# Patient Record
Sex: Female | Born: 1975
Health system: Southern US, Community
[De-identification: ages and names within clinical notes are randomized; demographics above are authoritative.]

## PROBLEM LIST (undated history)

## (undated) DIAGNOSIS — I10 Essential (primary) hypertension: Secondary | ICD-10-CM

## (undated) DIAGNOSIS — K209 Esophagitis, unspecified without bleeding: Secondary | ICD-10-CM

## (undated) DIAGNOSIS — G43909 Migraine, unspecified, not intractable, without status migrainosus: Secondary | ICD-10-CM

## (undated) HISTORY — DX: Esophagitis, unspecified: K20.9

## (undated) HISTORY — DX: Essential (primary) hypertension: I10

## (undated) HISTORY — DX: Migraine, unspecified, not intractable, without status migrainosus: G43.909

## (undated) HISTORY — DX: Esophagitis, unspecified without bleeding: K20.90

---

## 2003-10-17 HISTORY — PX: OTHER SURGICAL HISTORY: SHX169

## 2005-10-16 HISTORY — PX: OTHER SURGICAL HISTORY: SHX169

## 2010-11-29 DIAGNOSIS — R896 Abnormal cytological findings in specimens from other organs, systems and tissues: Secondary | ICD-10-CM | POA: Insufficient documentation

## 2013-04-21 ENCOUNTER — Ambulatory Visit (INDEPENDENT_AMBULATORY_CARE_PROVIDER_SITE_OTHER): Payer: Managed Care, Other (non HMO) | Admitting: Sports Medicine

## 2013-04-21 ENCOUNTER — Encounter: Payer: Self-pay | Admitting: Sports Medicine

## 2013-04-21 VITALS — BP 128/80 | HR 93 | Ht 63.5 in | Wt 153.0 lb

## 2013-04-21 DIAGNOSIS — M5412 Radiculopathy, cervical region: Secondary | ICD-10-CM

## 2013-04-21 MED ORDER — MELOXICAM 15 MG PO TABS: ORAL_TABLET | ORAL | Status: DC

## 2013-04-21 MED ORDER — KETOROLAC TROMETHAMINE 30 MG/ML IJ SOLN
30.0000 mg | Freq: Once | INTRAMUSCULAR | Status: AC
  Administered 2013-04-21: 30 mg via INTRAMUSCULAR

## 2013-04-21 MED ORDER — KETOROLAC TROMETHAMINE 30 MG/ML IJ SOLN: 30.0000 mg | Freq: Once | INTRAMUSCULAR | Status: DC

## 2013-04-21 NOTE — Progress Notes (Signed)
   Subjective:    I'm seeing this patient as a consultation for:  Dr. Laurence Spates  CC: Neck pain  HPI: This is a very pleasant 37 year old female who comes in with a several week history of pain that she localizes in the right side of her neck, radiating over her trapezius, and upper shoulder. She denies any trauma, and noted this came on somewhat suddenly. She finds any movement of her neck fairly painful, but denies any lower extremity symptoms, or constitutional symptoms. She also finds that her pain is significantly relieved when abducting and externally rotating her right shoulder. She was placed on prednisone, Flexeril, and narcotics by her primary care provider, and the patient is seeing me for further evaluation and definitive treatment. She denies any bowel or bladder dysfunction. Symptoms are moderate, persistent.  Past medical history, Surgical history, Family history not pertinant except as noted below, Social history, Allergies, and medications have been entered into the medical record, reviewed, and no changes needed.   Review of Systems: No headache, visual changes, nausea, vomiting, diarrhea, constipation, dizziness, abdominal pain, skin rash, fevers, chills, night sweats, weight loss, swollen lymph nodes, body aches, joint swelling, muscle aches, chest pain, shortness of breath, mood changes, visual or auditory hallucinations.   Objective:   General: Well Developed, well nourished, and in no acute distress.  Neuro/Psych: Alert and oriented x3, extra-ocular muscles intact, able to move all 4 extremities, sensation grossly intact. Skin: Warm and dry, no rashes noted.  Respiratory: Not using accessory muscles, speaking in full sentences, trachea midline.  Cardiovascular: Pulses palpable, no extremity edema. Abdomen: Does not appear distended. Neck: Inspection unremarkable. No palpable stepoffs. Negative Spurling's maneuver. Range of motion limited by pain. Grip strength and  sensation normal in bilateral hands Strength good C4 to T1 distribution No sensory change to C4 to T1 Negative Hoffman sign bilaterally Reflexes normal  Per patient report, her cervical spine x-ray showed loss of the normal cervical lordosis.  Impression and Recommendations:   This case required medical decision making of moderate complexity.

## 2013-04-21 NOTE — Patient Instructions (Addendum)
Cervical Radiculopathy  Cervical radiculopathy happens when a nerve in the neck is pinched or bruised by a slipped (herniated) disk or by arthritic changes in the bones of the cervical spine. This can occur due to an injury or as part of the normal aging process. Pressure on the cervical nerves can cause pain or numbness that runs from your neck all the way down into your arm and fingers.  CAUSES   There are many possible causes, including:   Injury.   Muscle tightness in the neck from overuse.   Swollen, painful joints (arthritis).   Breakdown or degeneration in the bones and joints of the spine (spondylosis) due to aging.   Bone spurs that may develop near the cervical nerves.  SYMPTOMS   Symptoms include pain, weakness, or numbness in the affected arm and hand. Pain can be severe or irritating. Symptoms may be worse when extending or turning the neck.  DIAGNOSIS   Your caregiver will ask about your symptoms and do a physical exam. He or she may test your strength and reflexes. X-rays, CT scans, and MRI scans may be needed in cases of injury or if the symptoms do not go away after a period of time. Electromyography (EMG) or nerve conduction testing may be done to study how your nerves and muscles are working.  TREATMENT   Your caregiver may recommend certain exercises to help relieve your symptoms. Cervical radiculopathy can, and often does, get better with time and treatment. If your problems continue, treatment options may include:   Wearing a soft collar for short periods of time.   Physical therapy to strengthen the neck muscles.   Medicines, such as nonsteroidal anti-inflammatory drugs (NSAIDs), oral corticosteroids, or spinal injections.   Surgery. Different types of surgery may be done depending on the cause of your problems.  HOME CARE INSTRUCTIONS    Put ice on the affected area.   Put ice in a plastic bag.   Place a towel between your skin and the bag.    Leave the ice on for 15-20 minutes, 3-4 times a day or as directed by your caregiver.   If ice does not help, you can try using heat. Take a warm shower or bath, or use a hot water bottle as directed by your caregiver.   You may try a gentle neck and shoulder massage.   Use a flat pillow when you sleep.   Only take over-the-counter or prescription medicines for pain, discomfort, or fever as directed by your caregiver.   If physical therapy was prescribed, follow your caregiver's directions.   If a soft collar was prescribed, use it as directed.  SEEK IMMEDIATE MEDICAL CARE IF:    Your pain gets much worse and cannot be controlled with medicines.   You have weakness or numbness in your hand, arm, face, or leg.   You have a high fever or a stiff, rigid neck.   You lose bowel or bladder control (incontinence).   You have trouble with walking, balance, or speaking.  MAKE SURE YOU:    Understand these instructions.   Will watch your condition.   Will get help right away if you are not doing well or get worse.  Document Released: 06/27/2001 Document Revised: 12/25/2011 Document Reviewed: 05/16/2011  [employer] Patient Information 2014 [employer].

## 2013-04-21 NOTE — Assessment & Plan Note (Signed)
Primary care provider has already started prednisone, Flexeril, hydrocodone. I would like her to do some rehabilitation exercises, she will visit the physical therapist once, and then do home exercises. Toradol 30 mg intramuscular today. She is to use the Flexeril at night. Return in 3 weeks, if no better will consider an MRI.

## 2013-04-22 ENCOUNTER — Ambulatory Visit (INDEPENDENT_AMBULATORY_CARE_PROVIDER_SITE_OTHER): Payer: Managed Care, Other (non HMO) | Admitting: Physical Therapy

## 2013-04-22 DIAGNOSIS — M25519 Pain in unspecified shoulder: Secondary | ICD-10-CM

## 2013-04-22 DIAGNOSIS — M542 Cervicalgia: Secondary | ICD-10-CM

## 2013-04-22 DIAGNOSIS — M5412 Radiculopathy, cervical region: Secondary | ICD-10-CM

## 2013-04-25 ENCOUNTER — Encounter (INDEPENDENT_AMBULATORY_CARE_PROVIDER_SITE_OTHER): Payer: Managed Care, Other (non HMO) | Admitting: Physical Therapy

## 2013-04-25 DIAGNOSIS — M5412 Radiculopathy, cervical region: Secondary | ICD-10-CM

## 2013-04-25 DIAGNOSIS — M25519 Pain in unspecified shoulder: Secondary | ICD-10-CM

## 2013-04-25 DIAGNOSIS — M542 Cervicalgia: Secondary | ICD-10-CM

## 2013-05-12 ENCOUNTER — Encounter: Payer: Self-pay | Admitting: Sports Medicine

## 2013-05-12 ENCOUNTER — Ambulatory Visit (INDEPENDENT_AMBULATORY_CARE_PROVIDER_SITE_OTHER): Payer: Managed Care, Other (non HMO) | Admitting: Sports Medicine

## 2013-05-12 VITALS — BP 148/91 | HR 78 | Wt 160.0 lb

## 2013-05-12 DIAGNOSIS — M5412 Radiculopathy, cervical region: Secondary | ICD-10-CM

## 2013-05-12 NOTE — Assessment & Plan Note (Signed)
Resolved with conservative measures. Return as needed.

## 2013-05-12 NOTE — Progress Notes (Signed)
  Subjective:    CC: Follow up  HPI: I saw this pleasant 37 year old female approximately 3 weeks ago with a right-sided cervical radiculitis. She has been through formal physical therapy, prednisone, Mobic, muscle relaxants. She returns today with symptoms resolved. She also notes that she continues to improve more and more on a daily basis if she does home exercises. She is happy with the results so far.  Past medical history, Surgical history, Family history not pertinant except as noted below, Social history, Allergies, and medications have been entered into the medical record, reviewed, and no changes needed.   Review of Systems: No fevers, chills, night sweats, weight loss, chest pain, or shortness of breath.   Objective:    General: Well Developed, well nourished, and in no acute distress.  Neuro: Alert and oriented x3, extra-ocular muscles intact, sensation grossly intact.  HEENT: Normocephalic, atraumatic, pupils equal round reactive to light, neck supple, no masses, no lymphadenopathy, thyroid nonpalpable.  Skin: Warm and dry, no rashes. Cardiac: Regular rate and rhythm, no murmurs rubs or gallops, no lower extremity edema.  Respiratory: Clear to auscultation bilaterally. Not using accessory muscles, speaking in full sentences. Neck: Inspection unremarkable. No palpable stepoffs. Negative Spurling's maneuver. Full neck range of motion Grip strength and sensation normal in bilateral hands Strength good C4 to T1 distribution No sensory change to C4 to T1 Negative Hoffman sign bilaterally Reflexes normal  Impression and Recommendations:

## 2013-07-11 ENCOUNTER — Ambulatory Visit (INDEPENDENT_AMBULATORY_CARE_PROVIDER_SITE_OTHER): Payer: Managed Care, Other (non HMO) | Admitting: Sports Medicine

## 2013-07-11 ENCOUNTER — Telehealth: Payer: Self-pay | Admitting: *Deleted

## 2013-07-11 ENCOUNTER — Encounter: Payer: Self-pay | Admitting: Sports Medicine

## 2013-07-11 VITALS — BP 128/73 | HR 78 | Wt 159.0 lb

## 2013-07-11 DIAGNOSIS — L210 Seborrhea capitis: Secondary | ICD-10-CM | POA: Insufficient documentation

## 2013-07-11 DIAGNOSIS — E781 Pure hyperglyceridemia: Secondary | ICD-10-CM

## 2013-07-11 DIAGNOSIS — I10 Essential (primary) hypertension: Secondary | ICD-10-CM | POA: Insufficient documentation

## 2013-07-11 DIAGNOSIS — M5412 Radiculopathy, cervical region: Secondary | ICD-10-CM

## 2013-07-11 DIAGNOSIS — Z0000 Encounter for general adult medical examination without abnormal findings: Secondary | ICD-10-CM | POA: Insufficient documentation

## 2013-07-11 DIAGNOSIS — M25521 Pain in right elbow: Secondary | ICD-10-CM | POA: Insufficient documentation

## 2013-07-11 DIAGNOSIS — Z299 Encounter for prophylactic measures, unspecified: Secondary | ICD-10-CM

## 2013-07-11 MED ORDER — MELOXICAM 15 MG PO TABS: ORAL_TABLET | ORAL | Status: DC

## 2013-07-11 MED ORDER — CYCLOBENZAPRINE HCL 10 MG PO TABS: 10.0000 mg | ORAL_TABLET | Freq: Three times a day (TID) | ORAL | Status: DC | PRN

## 2013-07-11 MED ORDER — BETAMETHASONE DIPROPIONATE 0.05 % EX LOTN: TOPICAL_LOTION | Freq: Two times a day (BID) | CUTANEOUS | Status: DC

## 2013-07-11 NOTE — Assessment & Plan Note (Signed)
Adding betamethasone 0.05% lotion.

## 2013-07-11 NOTE — Assessment & Plan Note (Signed)
Persistent pain, dorsal scapular now. Restart meloxicam and home exercises. Cervical spine MRI. She has had x-rays but the beginning of this year the results of which are not yet available. Return to go over results of the MRI. This will be for interventional injection planning per patient request.

## 2013-07-11 NOTE — Progress Notes (Signed)
  Subjective:    CC: Complete physical  HPI:  Preventive measures: Up-to-date on Pap smear, tetanus. Today she will get the flu shot. Pap smear is done by a gynecologist in the area.  Cervical radiculitis will improve for a while, now still having some pain which he localizes over her dorsal scapular region. Worse with turning her neck to the left and to the right. She does recall x-rays in the past approximately the beginning of this year it did show "arthritis" per patient.  Right elbow pain: Just started recently, localized over the brachial radialis, worse with supinating and resisted supination.  Scalp seborrhea: Needs refill of betamethasone 0.05% lotion.  Past medical history, Surgical history, Family history not pertinant except as noted below, Social history, Allergies, and medications have been entered into the medical record, reviewed, and no changes needed.   Review of Systems: No headache, visual changes, nausea, vomiting, diarrhea, constipation, dizziness, abdominal pain, skin rash, fevers, chills, night sweats, swollen lymph nodes, weight loss, chest pain, body aches, joint swelling, muscle aches, shortness of breath, mood changes, visual or auditory hallucinations.  Objective:    General: Well Developed, well nourished, and in no acute distress.  Neuro: Alert and oriented x3, extra-ocular muscles intact, sensation grossly intact.  HEENT: Normocephalic, atraumatic, pupils equal round reactive to light, neck supple, no masses, no lymphadenopathy, thyroid nonpalpable.  Skin: Warm and dry, no rashes noted.  Cardiac: Regular rate and rhythm, no murmurs rubs or gallops.  Respiratory: Clear to auscultation bilaterally. Not using accessory muscles, speaking in full sentences.  Abdominal: Soft, nontender, nondistended, positive bowel sounds, no masses, no organomegaly.  Right Elbow: Unremarkable to inspection. Range of motion full pronation, supination, flexion,  extension. Strength is full to all of the above directions , there is reproduction of pain with activation of the supinator. There is also discrete tenderness to palpation over the brachioradialis proximally. Stable to varus, valgus stress. Negative moving valgus stress test. Ulnar nerve does not sublux. Negative cubital tunnel Tinel's. Neck: Inspection unremarkable. No palpable stepoffs. Negative Spurling's maneuver. Full neck range of motion Grip strength and sensation normal in bilateral hands Strength good C4 to T1 distribution No sensory change to C4 to T1 Negative Hoffman sign bilaterally Reflexes hypoactive predominately to biceps and brachioradialis on the right side.  Impression and Recommendations:    The patient was counselled, risk factors were discussed, anticipatory guidance given.

## 2013-07-11 NOTE — Assessment & Plan Note (Signed)
Well controlled on Avapro and hydrochlorothiazide. No changes.

## 2013-07-11 NOTE — Assessment & Plan Note (Signed)
Complete physical exam performed. Checking routine blood work.

## 2013-07-11 NOTE — Assessment & Plan Note (Signed)
This likely represents a supinator strain. Mobic. Tennis elbow exercises.

## 2013-07-11 NOTE — Telephone Encounter (Signed)
PA obtained for MRI Cervical spine w/o contrast.  Auth# [id number].  Meyer Cory, LPN

## 2013-07-15 ENCOUNTER — Ambulatory Visit (HOSPITAL_BASED_OUTPATIENT_CLINIC_OR_DEPARTMENT_OTHER): Payer: Managed Care, Other (non HMO)

## 2013-07-15 LAB — COMPREHENSIVE METABOLIC PANEL
AST: 20 U/L (ref 0–37)
Albumin: 4 g/dL (ref 3.5–5.2)
Alkaline Phosphatase: 63 U/L (ref 39–117)
BUN: 11 mg/dL (ref 6–23)
Calcium: 9.1 mg/dL (ref 8.4–10.5)
Glucose, Bld: 87 mg/dL (ref 70–99)
Potassium: 4.3 mEq/L (ref 3.5–5.3)
Sodium: 139 mEq/L (ref 135–145)
Total Bilirubin: 0.6 mg/dL (ref 0.3–1.2)
Total Protein: 7 g/dL (ref 6.0–8.3)

## 2013-07-15 LAB — LIPID PANEL
Cholesterol: 184 mg/dL (ref 0–200)
HDL: 38 mg/dL — ABNORMAL LOW (ref >39)
LDL Cholesterol: 103 mg/dL — ABNORMAL HIGH (ref 0–99)
Total CHOL/HDL Ratio: 4.8 ratio
Triglycerides: 213 mg/dL — ABNORMAL HIGH (ref <150)
VLDL: 43 mg/dL — ABNORMAL HIGH (ref 0–40)

## 2013-07-15 LAB — CBC
HCT: 39.4 % (ref 36.0–46.0)
Hemoglobin: 13.7 g/dL (ref 12.0–15.0)
MCH: 30.2 pg (ref 26.0–34.0)
MCHC: 34.8 g/dL (ref 30.0–36.0)
MCV: 86.8 fL (ref 78.0–100.0)
Platelets: 240 K/uL (ref 150–400)
RBC: 4.54 MIL/uL (ref 3.87–5.11)
RDW: 14.1 % (ref 11.5–15.5)
WBC: 6.2 K/uL (ref 4.0–10.5)

## 2013-07-15 LAB — TSH: TSH: 0.965 u[IU]/mL (ref 0.350–4.500)

## 2013-07-15 LAB — COMPREHENSIVE METABOLIC PANEL WITH GFR
ALT: 31 U/L (ref 0–35)
CO2: 28 meq/L (ref 19–32)
Chloride: 102 meq/L (ref 96–112)
Creat: 0.62 mg/dL (ref 0.50–1.10)

## 2013-07-16 ENCOUNTER — Telehealth: Payer: Self-pay | Admitting: *Deleted

## 2013-07-16 NOTE — Telephone Encounter (Signed)
Facility changed to [facility] per pt request.  Meyer Cory, LPN

## 2013-07-17 DIAGNOSIS — E781 Pure hyperglyceridemia: Secondary | ICD-10-CM | POA: Insufficient documentation

## 2013-07-17 MED ORDER — OMEGA-3-ACID ETHYL ESTERS 1 G PO CAPS: 2.0000 g | ORAL_CAPSULE | Freq: Two times a day (BID) | ORAL | Status: DC

## 2013-07-17 NOTE — Addendum Note (Signed)
Addended by: Monica Becton on: 07/17/2013 08:59 AM   Modules accepted: Orders

## 2013-07-19 ENCOUNTER — Ambulatory Visit
Admission: RE | Admit: 2013-07-19 | Discharge: 2013-07-19 | Disposition: A | Discharge status: Home / Self Care | Payer: BC Managed Care – PPO | Source: Ambulatory Visit | Attending: Sports Medicine | Admitting: Sports Medicine

## 2013-07-19 DIAGNOSIS — M5412 Radiculopathy, cervical region: Secondary | ICD-10-CM

## 2013-07-22 ENCOUNTER — Encounter: Payer: Self-pay | Admitting: Sports Medicine

## 2013-07-23 ENCOUNTER — Encounter: Payer: Self-pay | Admitting: Sports Medicine

## 2013-07-23 ENCOUNTER — Ambulatory Visit (INDEPENDENT_AMBULATORY_CARE_PROVIDER_SITE_OTHER): Payer: Managed Care, Other (non HMO) | Admitting: Sports Medicine

## 2013-07-23 VITALS — BP 129/82 | HR 79 | Wt 153.0 lb

## 2013-07-23 DIAGNOSIS — I10 Essential (primary) hypertension: Secondary | ICD-10-CM

## 2013-07-23 DIAGNOSIS — E781 Pure hyperglyceridemia: Secondary | ICD-10-CM

## 2013-07-23 DIAGNOSIS — M5412 Radiculopathy, cervical region: Secondary | ICD-10-CM

## 2013-07-23 NOTE — Assessment & Plan Note (Signed)
Recently started fish oil. LDL goal is less than 130.

## 2013-07-23 NOTE — Assessment & Plan Note (Signed)
Well controlled. No changes. 

## 2013-07-23 NOTE — Progress Notes (Signed)
  Subjective:    CC: Followup  HPI: Hypertension: Stable and well controlled.  Hypertriglyceridemia: Stable on fish oil.  Cervical radiculitis: Seems to be left worse than right C6 over the dorsal scapular nerve. She has failed physical therapy, steroids, NSAIDs, muscle relaxers. Had an MRI done recently, the results of which will be dictated below.  Past medical history, Surgical history, Family history not pertinant except as noted below, Social history, Allergies, and medications have been entered into the medical record, reviewed, and no changes needed.   Review of Systems: No fevers, chills, night sweats, weight loss, chest pain, or shortness of breath.   Objective:    General: Well Developed, well nourished, and in no acute distress.  Neuro: Alert and oriented x3, extra-ocular muscles intact, sensation grossly intact.  HEENT: Normocephalic, atraumatic, pupils equal round reactive to light, neck supple, no masses, no lymphadenopathy, thyroid nonpalpable.  Skin: Warm and dry, no rashes. Cardiac: Regular rate and rhythm, no murmurs rubs or gallops, no lower extremity edema.  Respiratory: Clear to auscultation bilaterally. Not using accessory muscles, speaking in full sentences.  Cervical spine MRI shows a large disc protrusion at C5-C6 level causing mild central canal stenosis and bilateral foraminal stenosis, there is a smaller disc protrusion at the C6-C7 level.  Impression and Recommendations:

## 2013-07-23 NOTE — Assessment & Plan Note (Signed)
This pleasant young lady has left and right, left worse than right cervical radiculitis, clinically in a C6 distribution over the dorsal scapula. She has failed physical therapy, NSAIDs, steroids, muscle relaxers. At this point we are going to proceed with epidural injection. Left-sided C6-C7 interlaminar epidural steroid injection. She is resistant to taking any form of oral medication for this.

## 2013-08-14 ENCOUNTER — Ambulatory Visit
Admission: RE | Admit: 2013-08-14 | Discharge: 2013-08-14 | Disposition: A | Discharge status: Home / Self Care | Payer: BC Managed Care – PPO | Source: Ambulatory Visit | Attending: Sports Medicine | Admitting: Sports Medicine

## 2013-08-14 MED ORDER — TRIAMCINOLONE ACETONIDE 40 MG/ML IJ SUSP (RADIOLOGY)
60.0000 mg | Freq: Once | INTRAMUSCULAR | Status: AC
  Administered 2013-08-14: 60 mg via EPIDURAL

## 2013-08-14 MED ORDER — IOHEXOL 300 MG/ML  SOLN
1.0000 mL | Freq: Once | INTRAMUSCULAR | Status: AC | PRN
  Administered 2013-08-14: 1 mL via EPIDURAL

## 2013-08-21 ENCOUNTER — Other Ambulatory Visit: Payer: Self-pay

## 2013-12-26 LAB — TESTOSTERONE
TESTOSTERONE FREE: 6.3
TESTOSTERONE: 33

## 2014-01-06 ENCOUNTER — Encounter: Payer: Self-pay | Admitting: *Deleted

## 2014-03-10 ENCOUNTER — Encounter: Payer: Self-pay | Admitting: Sports Medicine

## 2014-03-10 MED ORDER — PROPRANOLOL HCL 40 MG PO TABS: ORAL_TABLET | ORAL | Status: DC

## 2014-03-24 ENCOUNTER — Other Ambulatory Visit: Payer: Self-pay

## 2014-03-24 MED ORDER — DIAZEPAM 5 MG PO TABS: ORAL_TABLET | ORAL | Status: DC

## 2014-03-24 NOTE — Addendum Note (Signed)
Addended by: Monica Becton on: 03/24/2014 12:01 PM   Modules accepted: Orders, Medications

## 2014-07-09 ENCOUNTER — Encounter: Payer: Self-pay | Admitting: Sports Medicine

## 2014-07-13 ENCOUNTER — Encounter: Payer: Self-pay | Admitting: Sports Medicine

## 2014-07-14 NOTE — Telephone Encounter (Signed)
Please see patient's note, please fax my note to the pain doctor listed.

## 2014-07-17 ENCOUNTER — Ambulatory Visit (INDEPENDENT_AMBULATORY_CARE_PROVIDER_SITE_OTHER): Payer: BC Managed Care – PPO | Admitting: Sports Medicine

## 2014-07-17 ENCOUNTER — Encounter: Payer: Self-pay | Admitting: Sports Medicine

## 2014-07-17 VITALS — BP 133/85 | HR 72 | Ht 63.0 in | Wt 154.0 lb

## 2014-07-17 DIAGNOSIS — M545 Low back pain, unspecified: Secondary | ICD-10-CM

## 2014-07-17 DIAGNOSIS — M5136 Other intervertebral disc degeneration, lumbar region: Secondary | ICD-10-CM | POA: Insufficient documentation

## 2014-07-17 DIAGNOSIS — M5412 Radiculopathy, cervical region: Secondary | ICD-10-CM

## 2014-07-17 DIAGNOSIS — M51369 Other intervertebral disc degeneration, lumbar region without mention of lumbar back pain or lower extremity pain: Secondary | ICD-10-CM | POA: Insufficient documentation

## 2014-07-17 MED ORDER — DICLOFENAC SODIUM 75 MG PO TBEC: 75.0000 mg | DELAYED_RELEASE_TABLET | Freq: Two times a day (BID) | ORAL | Status: DC

## 2014-07-17 NOTE — Assessment & Plan Note (Signed)
Patient will obtain her lumbar spine MRI. From that results we can decide where to proceed with intervention on her lumbar spine.

## 2014-07-17 NOTE — Progress Notes (Signed)
  Subjective:    CC: Followup  HPI: Cervical degenerative disc disease: With moderate central canal stenosis, she had a good response to a cervical epidural with complete relief of radicular symptoms and pain approximately one year ago. Unfortunately pain has recurred, she no longer has radicular symptoms. Currently taking meloxicam and cyclobenzaprine. It is moderate, persistent. She does not desire to consider surgical intervention.  Low back pain: History of lumbar degenerative disc disease, she does tell me she had an MRI at an outside facility distant past, she will be able to provide a CD for me for review.  Past medical history, Surgical history, Family history not pertinant except as noted below, Social history, Allergies, and medications have been entered into the medical record, reviewed, and no changes needed.   Review of Systems: No fevers, chills, night sweats, weight loss, chest pain, or shortness of breath.   Objective:    General: Well Developed, well nourished, and in no acute distress.  Neuro: Alert and oriented x3, extra-ocular muscles intact, sensation grossly intact.  HEENT: Normocephalic, atraumatic, pupils equal round reactive to light, neck supple, no masses, no lymphadenopathy, thyroid nonpalpable.  Skin: Warm and dry, no rashes. Cardiac: Regular rate and rhythm, no murmurs rubs or gallops, no lower extremity edema.  Respiratory: Clear to auscultation bilaterally. Not using accessory muscles, speaking in full sentences. Neck: Negative spurling's Full neck range of motion Grip strength and sensation normal in bilateral hands Strength good C4 to T1 distribution No sensory change to C4 to T1 Reflexes normal  Impression and Recommendations:

## 2014-07-17 NOTE — Assessment & Plan Note (Signed)
Excellent response to prior epidural. She does have a C5-6 and C6-C7 moderate sized disc protrusions likely causing her right-sided periscapular symptoms. Switching to Voltaren, cyclobenzaprine one half tab that time, repeat epidural.

## 2014-07-21 ENCOUNTER — Other Ambulatory Visit: Payer: Self-pay | Admitting: Psychiatry

## 2014-07-21 DIAGNOSIS — G43719 Chronic migraine without aura, intractable, without status migrainosus: Secondary | ICD-10-CM

## 2014-07-22 ENCOUNTER — Other Ambulatory Visit: Payer: BC Managed Care – PPO

## 2014-07-22 ENCOUNTER — Telehealth: Payer: Self-pay

## 2014-07-22 ENCOUNTER — Encounter: Payer: Self-pay | Admitting: Sports Medicine

## 2014-07-22 NOTE — Telephone Encounter (Signed)
Debbie Johnston called stated that patient decided not to have epidural done at this time. Debbie Johnston,CMA

## 2014-07-27 ENCOUNTER — Other Ambulatory Visit: Payer: Self-pay | Admitting: Sports Medicine

## 2014-07-27 ENCOUNTER — Telehealth: Payer: Self-pay

## 2014-07-27 DIAGNOSIS — M5136 Other intervertebral disc degeneration, lumbar region: Secondary | ICD-10-CM

## 2014-07-27 DIAGNOSIS — M51369 Other intervertebral disc degeneration, lumbar region without mention of lumbar back pain or lower extremity pain: Secondary | ICD-10-CM

## 2014-07-27 NOTE — Telephone Encounter (Signed)
MRI lumbar spine without contrast does not require PA, per Fanny SkatesGretchen G.

## 2014-07-28 ENCOUNTER — Telehealth: Payer: Self-pay | Admitting: *Deleted

## 2014-07-28 NOTE — Telephone Encounter (Signed)
Helen from radiology called to advise that patient would like to have her MRI done in [place]. Elease Hashimotoatricia in referrels notified. Corliss SkainsJamie Tariq Pernell, CMA

## 2014-07-31 ENCOUNTER — Ambulatory Visit
Admission: RE | Admit: 2014-07-31 | Discharge: 2014-07-31 | Disposition: A | Discharge status: Home / Self Care | Payer: BC Managed Care – PPO | Source: Ambulatory Visit | Attending: Psychiatry | Admitting: Psychiatry

## 2014-07-31 DIAGNOSIS — G43719 Chronic migraine without aura, intractable, without status migrainosus: Secondary | ICD-10-CM

## 2014-08-12 ENCOUNTER — Ambulatory Visit
Admission: RE | Admit: 2014-08-12 | Discharge: 2014-08-12 | Disposition: A | Discharge status: Home / Self Care | Payer: BC Managed Care – PPO | Source: Ambulatory Visit | Attending: Sports Medicine | Admitting: Sports Medicine

## 2014-08-12 DIAGNOSIS — M5136 Other intervertebral disc degeneration, lumbar region: Secondary | ICD-10-CM

## 2014-08-17 ENCOUNTER — Ambulatory Visit (INDEPENDENT_AMBULATORY_CARE_PROVIDER_SITE_OTHER): Payer: BC Managed Care – PPO | Admitting: Sports Medicine

## 2014-08-17 DIAGNOSIS — M5412 Radiculopathy, cervical region: Secondary | ICD-10-CM

## 2014-08-17 DIAGNOSIS — M5136 Other intervertebral disc degeneration, lumbar region: Secondary | ICD-10-CM

## 2014-08-17 DIAGNOSIS — M51369 Other intervertebral disc degeneration, lumbar region without mention of lumbar back pain or lower extremity pain: Secondary | ICD-10-CM

## 2014-08-17 MED ORDER — DIAZEPAM 10 MG PO TABS: ORAL_TABLET | ORAL | Status: DC

## 2014-08-17 NOTE — Assessment & Plan Note (Addendum)
It has been one year since the most recent cervical epidural. Repeat left-sided C6-C7 intralaminar epidural. Considering severity of lumbar spondylosis and cervical spondylosis, combined with diffuse white matter hyperintensities on brain MRI we are going to do a rheumatoid workup.

## 2014-08-17 NOTE — Progress Notes (Signed)
  Subjective:    CC: follow-up MRI  HPI: This is a very pleasant 38 year old female, she does have extensive cervical and lumbar spondylosis. She had a cervical epidural approximately 13 months ago and had a fantastic response. Now starting to have a slight increase in pain.  Lumbar spondylosis: With discogenic type back pain worse than the left side, and severe disc and facet disease on MRI. She has failed conservative measures and is amenable to interventional treatment.  Headaches: Brain MRI per neurology, unfortunately there were scattered T2 hyperintensities, she is going to undergo a spinal tap for further evaluation for possible multiple sclerosis. Considering her extensive cervical and lumbar spondylosis we will probably also perform a rheumatoid workup.  Past medical history, Surgical history, Family history not pertinant except as noted below, Social history, Allergies, and medications have been entered into the medical record, reviewed, and no changes needed.   Review of Systems: No fevers, chills, night sweats, weight loss, chest pain, or shortness of breath.   Objective:    General: Well Developed, well nourished, and in no acute distress.  Neuro: Alert and oriented x3, extra-ocular muscles intact, sensation grossly intact.  HEENT: Normocephalic, atraumatic, pupils equal round reactive to light, neck supple, no masses, no lymphadenopathy, thyroid nonpalpable.  Skin: Warm and dry, no rashes. Cardiac: Regular rate and rhythm, no murmurs rubs or gallops, no lower extremity edema.  Respiratory: Clear to auscultation bilaterally. Not using accessory muscles, speaking in full sentences.  Lumbar spine MRI shows multilevel degenerative disc disease worst at the L4-5 and the L5-S1 level. There is also multilevel bilateral facet arthritis from L3-S1.  Impression and Recommendations:

## 2014-08-17 NOTE — Assessment & Plan Note (Signed)
Severe multilevel degenerative disc disease. Worst at the L5-S1 level. There is also multilevel facet arthritis from L3-S1 bilaterally. Pain is worst on the left. We will start with a left-sided L5-S1 interlaminar epidural. If no relief then we can proceed with facet injections.

## 2014-08-19 ENCOUNTER — Telehealth: Payer: Self-pay | Admitting: *Deleted

## 2014-08-19 MED ORDER — TRAMADOL HCL 50 MG PO TABS: ORAL_TABLET | ORAL | Status: DC

## 2014-08-19 NOTE — Telephone Encounter (Signed)
Dianesse calls requesting a medication be called in for her headaches. She said that she saw you yesterday. Corliss SkainsJamie Darlyne Schmiesing, CMA

## 2014-08-19 NOTE — Telephone Encounter (Signed)
Adding tramadol for pain, prescription is inbox.

## 2014-08-19 NOTE — Telephone Encounter (Signed)
Patient notified. Would like to know if you could give her a work slip since she missed work 1 day? Please advise. Corliss SkainsJamie Nico Rogness, CMA

## 2014-08-20 NOTE — Telephone Encounter (Signed)
Patient said that she was going to contact you through MyChart about the days. She was here in the office and I think it is going to be today and tomorrow. Debbie SkainsJamie Tevion Johnston, CMA

## 2014-08-20 NOTE — Telephone Encounter (Signed)
Sure, but....what day?

## 2014-09-01 ENCOUNTER — Ambulatory Visit
Admission: RE | Admit: 2014-09-01 | Discharge: 2014-09-01 | Disposition: A | Discharge status: Home / Self Care | Payer: BC Managed Care – PPO | Source: Ambulatory Visit | Attending: Sports Medicine | Admitting: Sports Medicine

## 2014-09-01 DIAGNOSIS — M5412 Radiculopathy, cervical region: Secondary | ICD-10-CM

## 2014-09-01 MED ORDER — IOHEXOL 300 MG/ML  SOLN
1.0000 mL | Freq: Once | INTRAMUSCULAR | Status: AC | PRN
  Administered 2014-09-01: 1 mL via EPIDURAL

## 2014-09-01 MED ORDER — TRIAMCINOLONE ACETONIDE 40 MG/ML IJ SUSP (RADIOLOGY)
60.0000 mg | Freq: Once | INTRAMUSCULAR | Status: AC
Start: 2014-09-01 — End: 2014-09-01
  Administered 2014-09-01: 60 mg via EPIDURAL

## 2014-09-01 NOTE — Discharge Instructions (Signed)

## 2014-09-14 ENCOUNTER — Ambulatory Visit (INDEPENDENT_AMBULATORY_CARE_PROVIDER_SITE_OTHER): Payer: BC Managed Care – PPO | Admitting: Sports Medicine

## 2014-09-14 ENCOUNTER — Encounter: Payer: Self-pay | Admitting: Sports Medicine

## 2014-09-14 VITALS — BP 130/83 | HR 83 | Ht 63.0 in | Wt 156.0 lb

## 2014-09-14 DIAGNOSIS — M5412 Radiculopathy, cervical region: Secondary | ICD-10-CM | POA: Diagnosis not present

## 2014-09-14 DIAGNOSIS — M51369 Other intervertebral disc degeneration, lumbar region without mention of lumbar back pain or lower extremity pain: Secondary | ICD-10-CM

## 2014-09-14 DIAGNOSIS — K209 Esophagitis, unspecified without bleeding: Secondary | ICD-10-CM | POA: Insufficient documentation

## 2014-09-14 DIAGNOSIS — M5136 Other intervertebral disc degeneration, lumbar region: Secondary | ICD-10-CM | POA: Diagnosis not present

## 2014-09-14 NOTE — Assessment & Plan Note (Signed)
Stable, continue Nexium.  °

## 2014-09-14 NOTE — Assessment & Plan Note (Signed)
Still awaiting lumbar epidural. She has severe multilevel degenerative disc disease with multilevel facet arthritis from L3-S1 bilaterally. We did order a left-sided L5-S1 interlaminar which she has to schedule, if no relief then we can proceed with facet injections. Return to see me 2 weeks after her lumbar epidural.

## 2014-09-14 NOTE — Assessment & Plan Note (Signed)
Resolved after cervical epidural

## 2014-09-14 NOTE — Progress Notes (Signed)
  Subjective:    CC: Follow-up  HPI: Cervical degenerative disc disease: Resolved after cervical epidural.  Lumbar spondylosis: Has not yet had lumbar epidural.  Headaches: Resolved, she does have a neurologist and did have various T2 hyperintensities on her MRI that were nonspecific. Rheumatoid workup was negative.  Esophagitis: Noticed on endoscopy, biopsies were taken that showed simple esophagitis without evidence of malignancy, no evidence of Barrett's esophagus, and no fungal organisms seen. She is doing extremely well with Nexium daily.  Past medical history, Surgical history, Family history not pertinant except as noted below, Social history, Allergies, and medications have been entered into the medical record, reviewed, and no changes needed.   Review of Systems: No fevers, chills, night sweats, weight loss, chest pain, or shortness of breath.   Objective:    General: Well Developed, well nourished, and in no acute distress.  Neuro: Alert and oriented x3, extra-ocular muscles intact, sensation grossly intact.  HEENT: Normocephalic, atraumatic, pupils equal round reactive to light, neck supple, no masses, no lymphadenopathy, thyroid nonpalpable.  Skin: Warm and dry, no rashes. Cardiac: Regular rate and rhythm, no murmurs rubs or gallops, no lower extremity edema.  Respiratory: Clear to auscultation bilaterally. Not using accessory muscles, speaking in full sentences.  Impression and Recommendations:

## 2014-09-15 ENCOUNTER — Encounter: Payer: Self-pay | Admitting: Sports Medicine

## 2014-09-29 ENCOUNTER — Ambulatory Visit
Admission: RE | Admit: 2014-09-29 | Discharge: 2014-09-29 | Disposition: A | Discharge status: Home / Self Care | Payer: BC Managed Care – PPO | Source: Ambulatory Visit | Attending: Sports Medicine | Admitting: Sports Medicine

## 2014-09-29 DIAGNOSIS — M5136 Other intervertebral disc degeneration, lumbar region: Secondary | ICD-10-CM

## 2014-09-29 MED ORDER — METHYLPREDNISOLONE ACETATE 40 MG/ML INJ SUSP (RADIOLOG
120.0000 mg | Freq: Once | INTRAMUSCULAR | Status: AC
  Administered 2014-09-29: 120 mg via EPIDURAL

## 2014-09-29 MED ORDER — IOHEXOL 180 MG/ML  SOLN
1.0000 mL | Freq: Once | INTRAMUSCULAR | Status: AC | PRN
  Administered 2014-09-29: 1 mL via EPIDURAL

## 2014-10-01 ENCOUNTER — Encounter: Payer: Self-pay | Admitting: Sports Medicine

## 2014-10-01 MED ORDER — BUTALBITAL-APAP-CAFFEINE 50-325-40 MG PO TABS: 1.0000 | ORAL_TABLET | Freq: Three times a day (TID) | ORAL | Status: DC | PRN

## 2015-01-28 ENCOUNTER — Other Ambulatory Visit: Payer: Self-pay | Admitting: Sports Medicine

## 2015-01-28 MED ORDER — IRBESARTAN 150 MG PO TABS: 150.0000 mg | ORAL_TABLET | Freq: Two times a day (BID) | ORAL | Status: DC

## 2015-03-01 ENCOUNTER — Encounter: Payer: Self-pay | Admitting: Sports Medicine

## 2015-03-01 ENCOUNTER — Ambulatory Visit (INDEPENDENT_AMBULATORY_CARE_PROVIDER_SITE_OTHER): Payer: BLUE CROSS/BLUE SHIELD | Admitting: Sports Medicine

## 2015-03-01 VITALS — BP 135/80 | HR 91 | Wt 153.0 lb

## 2015-03-01 DIAGNOSIS — R002 Palpitations: Secondary | ICD-10-CM | POA: Insufficient documentation

## 2015-03-01 DIAGNOSIS — G43009 Migraine without aura, not intractable, without status migrainosus: Secondary | ICD-10-CM | POA: Diagnosis not present

## 2015-03-01 DIAGNOSIS — G43909 Migraine, unspecified, not intractable, without status migrainosus: Secondary | ICD-10-CM | POA: Insufficient documentation

## 2015-03-01 DIAGNOSIS — L989 Disorder of the skin and subcutaneous tissue, unspecified: Secondary | ICD-10-CM | POA: Insufficient documentation

## 2015-03-01 DIAGNOSIS — M5412 Radiculopathy, cervical region: Secondary | ICD-10-CM

## 2015-03-01 MED ORDER — TOPIRAMATE 50 MG PO TABS: ORAL_TABLET | ORAL | Status: DC

## 2015-03-01 MED ORDER — ELETRIPTAN HYDROBROMIDE 20 MG PO TABS: ORAL_TABLET | ORAL | Status: DC

## 2015-03-01 NOTE — Assessment & Plan Note (Signed)
For dominantly on the legs and thighs, asymptomatic, none are present today. She did show me pictures of several reddish macules, circular. When these recur she will come back and we can biopsy it

## 2015-03-01 NOTE — Assessment & Plan Note (Signed)
Referral to cardiology, patient will probably need a Holter monitor, palpitations are approximately once per week.

## 2015-03-01 NOTE — Assessment & Plan Note (Signed)
Repeat cervical epidural, 7 month response to the previous injection.

## 2015-03-01 NOTE — Assessment & Plan Note (Signed)
With multiple headaches per month. She did have some hyperintensities, T2 on a brain MRI, is currently being worked up by neurology, they're simply going to proceed with a another brain MRI in one year, lumbar puncture was recommended for opening pressure, to look for pseudotumor cerebri however we will wait until the next brain MRI. Until then I will treat her with Topamax and Relpax. Keep a migraine diary.

## 2015-03-01 NOTE — Progress Notes (Signed)
  Subjective:    CC: Headaches  HPI: This is a pleasant 39-year-old female, she has been being worked up for abnormal T2 hyperintensities in the white matter on a brain MRI, neurology has recommended a lumbar puncture however they have a very low suspicion for multiple sclerosis, and are happy with waiting until next year and to simply repeat an MRI. Unfortunately she has been having headaches, moderate, persistent bitemporal and throbbing without photophobia or phonophobia, has lasted 4 days, associated with nausea. Symptoms do sound very migrainous, she is amenable to trying both preventive and abortive treatments, she has 4+ migraines per month.  Cervical radiculopathy: Did really well for several months after a cervical epidural, she has noted that the headaches have worsened as well since her epidural has worn off. She does desire repeat interventional treatment.  Skin lesions: Random, localized in both lower extremities, no itching, no burning, she has one on her leg is starting to go away but shows me pictures of several others.  Palpitations: Nonexertional, random, no syncope or presyncope. No history of cardiac disease.  Past medical history, Surgical history, Family history not pertinant except as noted below, Social history, Allergies, and medications have been entered into the medical record, reviewed, and no changes needed.   Review of Systems: No fevers, chills, night sweats, weight loss, chest pain, or shortness of breath.   Objective:    General: Well Developed, well nourished, and in no acute distress.  Neuro: Alert and oriented x3, extra-ocular muscles intact, sensation grossly intact. Cranial nerves II through XII are intact, motor, sensory, and coordinative functions are all intact. HEENT: Normocephalic, atraumatic, pupils equal round reactive to light, neck supple, no masses, no lymphadenopathy, thyroid nonpalpable.  Skin: Warm and dry, no rashes. Cardiac: Regular rate and  rhythm, no murmurs rubs or gallops, no lower extremity edema.  Respiratory: Clear to auscultation bilaterally. Not using accessory muscles, speaking in full sentences.  Impression and Recommendations:   I spent 40 minutes with this patient, greater than 50% was face-to-face time counseling regarding the above diagnoses.

## 2015-03-02 ENCOUNTER — Encounter: Payer: Self-pay | Admitting: Cardiology

## 2015-03-08 ENCOUNTER — Encounter: Payer: Self-pay | Admitting: Sports Medicine

## 2015-03-08 MED ORDER — RIZATRIPTAN BENZOATE 10 MG PO TBDP: 10.0000 mg | ORAL_TABLET | ORAL | Status: DC | PRN

## 2015-03-29 ENCOUNTER — Ambulatory Visit: Payer: BLUE CROSS/BLUE SHIELD | Admitting: Sports Medicine

## 2015-04-07 ENCOUNTER — Other Ambulatory Visit: Payer: Self-pay | Admitting: *Deleted

## 2015-04-07 ENCOUNTER — Encounter: Payer: Self-pay | Admitting: Sports Medicine

## 2015-04-07 DIAGNOSIS — G43009 Migraine without aura, not intractable, without status migrainosus: Secondary | ICD-10-CM

## 2015-04-07 MED ORDER — TOPIRAMATE 50 MG PO TABS: ORAL_TABLET | ORAL | Status: DC

## 2015-04-28 ENCOUNTER — Ambulatory Visit: Payer: BLUE CROSS/BLUE SHIELD | Admitting: Cardiology

## 2015-05-12 ENCOUNTER — Ambulatory Visit (INDEPENDENT_AMBULATORY_CARE_PROVIDER_SITE_OTHER): Payer: BLUE CROSS/BLUE SHIELD | Admitting: Cardiology

## 2015-05-12 ENCOUNTER — Encounter: Payer: Self-pay | Admitting: Cardiology

## 2015-05-12 DIAGNOSIS — R002 Palpitations: Secondary | ICD-10-CM | POA: Diagnosis not present

## 2015-05-12 NOTE — Patient Instructions (Signed)
Your physician recommends that you schedule a follow-up appointment in: 3 MONTHS WITH DR Jens Som  Your physician has requested that you have an echocardiogram. Echocardiography is a painless test that uses sound waves to create images of your heart. It provides your doctor with information about the size and shape of your heart and how well your heart's chambers and valves are working. This procedure takes approximately one hour. There are no restrictions for this procedure.   Your physician recommends that you have lab work

## 2015-05-12 NOTE — Assessment & Plan Note (Signed)
Blood pressure controlled. Continue present medications. 

## 2015-05-12 NOTE — Assessment & Plan Note (Signed)
Etiology unclear. Schedule echocardiogram to assess LV function. Check TSH and potassium. She has not had any palpitations in the past 1 month. If they recur we will plan an event monitor to further evaluate. We could consider changing her blood pressure regimen and instead of taking an ARB/diuretic she could be treated with a beta blocker in the future if palpitations persist.

## 2015-05-12 NOTE — Progress Notes (Signed)
HPI: 39 year old female for evaluation of palpitations. For the past several months she has had intermittent palpitations described as her heart racing. They are sudden in onset. Last approximately 10 seconds and resolve spontaneously. Otherwise no dyspnea on exertion, orthopnea, PND, pedal edema, syncope or exertional chest pain. Note her palpitations have improved over the past 1 month.  Current Outpatient Prescriptions  Medication Sig Dispense Refill  . cyclobenzaprine (FLEXERIL) 10 MG tablet Take 0.5 tablets (5 mg total) by mouth at bedtime. 30 tablet 3  . diclofenac (VOLTAREN) 75 MG EC tablet Take 1 tablet (75 mg total) by mouth 2 (two) times daily. 60 tablet 3  . hydrochlorothiazide (MICROZIDE) 12.5 MG capsule Take 12.5 mg by mouth daily.    . irbesartan (AVAPRO) 150 MG tablet Take 1 tablet (150 mg total) by mouth 2 (two) times daily. 180 tablet 3  . Norgestimate-Ethinyl Estradiol Triphasic 0.18/0.215/0.25 MG-35 MCG tablet     . omega-3 acid ethyl esters (LOVAZA) 1 G capsule Take 2 capsules (2 g total) by mouth 2 (two) times daily. 180 capsule 3  . rizatriptan (MAXALT-MLT) 10 MG disintegrating tablet Take 1 tablet (10 mg total) by mouth as needed for migraine. May repeat in 2 hours if needed 10 tablet 0  . topiramate (TOPAMAX) 50 MG tablet One tab by mouth daily. 90 tablet 1  . traMADol (ULTRAM) 50 MG tablet 1-2 tabs by mouth Q8 hours, maximum 6 tabs per day. 90 tablet 0   No current facility-administered medications for this visit.    Allergies  Allergen Reactions  . Cephalosporins Shortness Of Breath    Increased HR  . Quinolones Shortness Of Breath    Increased HR     Past Medical History  Diagnosis Date  . Hypertension   . Esophagitis     Past Surgical History  Procedure Laterality Date  . Ovarian cyst removed  2007  . Gallbladder removed  2005    History   Social History  . Marital Status: Married    Spouse Name: N/A  . Number of Children: 1  . Years of  Education: N/A   Occupational History  . Not on file.   Social History Main Topics  . Smoking status: Never Smoker   . Smokeless tobacco: Not on file  . Alcohol Use: No  . Drug Use: No  . Sexual Activity: Yes   Other Topics Concern  . Not on file   Social History Narrative    Family History  Problem Relation Age of Onset  . Heart attack      grandmother  . Diabetes      grandmother  . Hypertension Mother     grandmother, aunt  . Stroke      grandmother    ROS: back pain but no fevers or chills, productive cough, hemoptysis, dysphasia, odynophagia, melena, hematochezia, dysuria, hematuria, rash, seizure activity, orthopnea, PND, pedal edema, claudication. Remaining systems are negative.  Physical Exam:   Blood pressure 122/74, pulse 86, height 5\' 3"  (1.6 m), weight 155 lb 1.9 oz (70.362 kg).  General:  Well developed/well nourished in NAD Skin warm/dry Patient not depressed No peripheral clubbing Back-normal HEENT-normal/normal eyelids Neck supple/normal carotid upstroke bilaterally; no bruits; no JVD; no thyromegaly chest - CTA/ normal expansion CV - RRR/normal S1 and S2; no murmurs, rubs or gallops;  PMI nondisplaced Abdomen -NT/ND, no HSM, no mass, + bowel sounds, no bruit 2+ femoral pulses, no bruits Ext-no edema, chords, 2+ DP Neuro-grossly nonfocal  ECG  NSR, nonspecific ST-T changes.

## 2015-05-20 ENCOUNTER — Encounter: Payer: Self-pay | Admitting: Sports Medicine

## 2015-05-21 ENCOUNTER — Other Ambulatory Visit: Payer: Self-pay | Admitting: *Deleted

## 2015-05-21 DIAGNOSIS — M5412 Radiculopathy, cervical region: Secondary | ICD-10-CM

## 2015-05-21 MED ORDER — DICLOFENAC SODIUM 75 MG PO TBEC: 75.0000 mg | DELAYED_RELEASE_TABLET | Freq: Two times a day (BID) | ORAL | Status: DC

## 2015-05-26 ENCOUNTER — Encounter: Payer: Self-pay | Admitting: Cardiology

## 2015-06-01 ENCOUNTER — Encounter: Payer: Self-pay | Admitting: Cardiology

## 2015-07-27 ENCOUNTER — Ambulatory Visit: Payer: BLUE CROSS/BLUE SHIELD | Admitting: Sports Medicine

## 2015-07-27 ENCOUNTER — Encounter: Payer: Self-pay | Admitting: Sports Medicine

## 2015-07-27 ENCOUNTER — Ambulatory Visit (INDEPENDENT_AMBULATORY_CARE_PROVIDER_SITE_OTHER): Payer: BLUE CROSS/BLUE SHIELD | Admitting: Sports Medicine

## 2015-07-27 ENCOUNTER — Encounter: Payer: BLUE CROSS/BLUE SHIELD | Admitting: Sports Medicine

## 2015-07-27 VITALS — BP 129/80 | HR 78 | Ht 63.0 in | Wt 158.0 lb

## 2015-07-27 DIAGNOSIS — K209 Esophagitis, unspecified without bleeding: Secondary | ICD-10-CM

## 2015-07-27 DIAGNOSIS — K580 Irritable bowel syndrome with diarrhea: Secondary | ICD-10-CM | POA: Diagnosis not present

## 2015-07-27 MED ORDER — HYOSCYAMINE SULFATE 0.125 MG PO TABS: 0.1250 mg | ORAL_TABLET | Freq: Three times a day (TID) | ORAL | Status: DC | PRN

## 2015-07-27 MED ORDER — PSYLLIUM 58.6 % PO POWD: 1.0000 | Freq: Three times a day (TID) | ORAL | Status: AC

## 2015-07-27 MED ORDER — BUTALBITAL-APAP-CAFFEINE 50-325-40 MG PO TABS: 1.0000 | ORAL_TABLET | Freq: Four times a day (QID) | ORAL | Status: DC | PRN

## 2015-07-27 NOTE — Progress Notes (Signed)
  Subjective:    CC:  Left lower quadrant pain  HPI: This is a pleasant 39 year old female, for several months she's had pain that she localizes in the left lower quadrant, worse after eating with an urge to defecate, and pain is relieved with defecation. She has seen her OB/GYN who does not suspect pelvic organ pathology. Symptoms are moderate, persistent. No constitutional symptoms, she was diagnosed with esophagitis by her gastroenterologist and placed on Nexium which seems to be helping, she is taking in the morning.  Migraines: Well controlled with current medications, uses approximately 5 fioricet per month, needs a refill.  Palpitations, resolved  Past medical history, Surgical history, Family history not pertinant except as noted below, Social history, Allergies, and medications have been entered into the medical record, reviewed, and no changes needed.   Review of Systems: No fevers, chills, night sweats, weight loss, chest pain, or shortness of breath.   Objective:    General: Well Developed, well nourished, and in no acute distress.  Neuro: Alert and oriented x3, extra-ocular muscles intact, sensation grossly intact.  HEENT: Normocephalic, atraumatic, pupils equal round reactive to light, neck supple, no masses, no lymphadenopathy, thyroid nonpalpable.  Skin: Warm and dry, no rashes. Cardiac: Regular rate and rhythm, no murmurs rubs or gallops, no lower extremity edema.  Respiratory: Clear to auscultation bilaterally. Not using accessory muscles, speaking in full sentences. Abdomen: Soft, minimally tender in the epigastrium but nothing in the left lower quadrant, no guarding, no rebound tenderness, normal bowel sounds, no palpable masses. No costovertebral angle pain.  Impression and Recommendations:

## 2015-07-27 NOTE — Assessment & Plan Note (Signed)
Classic. Adding Metamucil and Levsin. Return in one month.

## 2015-07-27 NOTE — Assessment & Plan Note (Signed)
Switching Nexium to dinnertime.

## 2015-07-27 NOTE — Patient Instructions (Signed)
Irritable Bowel Syndrome, Adult Irritable bowel syndrome (IBS) is not one specific disease. It is a group of symptoms that affects the organs responsible for digestion (gastrointestinal or GI tract).  To regulate how your GI tract works, your body sends signals back and forth between your intestines and your brain. If you have IBS, there may be a problem with these signals. As a result, your GI tract does not function normally. Your intestines may become more sensitive and overreact to certain things. This is especially true when you eat certain foods or when you are under stress.  There are four types of IBS. These may be determined based on the consistency of your stool:   IBS with diarrhea.   IBS with constipation.   Mixed IBS.   Unsubtyped IBS.  It is important to know which type of IBS you have. Some treatments are more likely to be helpful for certain types of IBS.  CAUSES  The exact cause of IBS is not known. RISK FACTORS You may have a higher risk of IBS if:  You are a woman.  You are younger than 41 years old.  You have a family history of IBS.  You have mental health problems.  You have had bacterial infection of your GI tract. SIGNS AND SYMPTOMS  Symptoms of IBS vary from person to person. The main symptom is abdominal pain or discomfort. Additional symptoms usually include one or more of the following:   Diarrhea, constipation, or both.   Abdominal swelling or bloating.   Feeling full or sick after eating a small or regular-size meal.   Frequent gas.   Mucus in the stool.   A feeling of having more stool left after a bowel movement.  Symptoms tend to come and go. They may be associated with stress, psychiatric conditions, or nothing at all.  DIAGNOSIS  There is no specific test to diagnose IBS. Your health care provider will make a diagnosis based on a physical exam, medical history, and your symptoms. You may have other tests to rule out other  conditions that may be causing your symptoms. These may include:   Blood tests.   X-rays.   CT scan.  Endoscopy and colonoscopy. This is a test in which your GI tract is viewed with a long, thin, flexible tube. TREATMENT There is no cure for IBS, but treatment can help relieve symptoms. IBS treatment often includes:   Changes to your diet, such as:  Eating more fiber.  Avoiding foods that cause symptoms.  Drinking more water.  Eating regular, medium-sized portioned meals.  Medicines. These may include:  Fiber supplements if you have constipation.  Medicine to control diarrhea (antidiarrheal medicines).  Medicine to help control muscle spasms in your GI tract (antispasmodic medicines).  Medicines to help with any mental health issues, such as antidepressants or tranquilizers.  Therapy.  Talk therapy may help with anxiety, depression, or other mental health issues that can make IBS symptoms worse.  Stress reduction.  Managing your stress can help keep symptoms under control. HOME CARE INSTRUCTIONS   Take medicines only as directed by your health care provider.  Eat a healthy diet.  Avoid foods and drinks with added sugar.  Include more whole grains, fruits, and vegetables gradually into your diet. This may be especially helpful if you have IBS with constipation.  Avoid any foods and drinks that make your symptoms worse. These may include dairy products and caffeinated or carbonated drinks.  Do not eat large meals.    Drink enough fluid to keep your urine clear or pale yellow.  Exercise regularly. Ask your health care provider for recommendations of good activities for you.  Keep all follow-up visits as directed by your health care provider. This is important. SEEK MEDICAL CARE IF:   You have constant pain.  You have trouble or pain with swallowing.  You have worsening diarrhea. SEEK IMMEDIATE MEDICAL CARE IF:   You have severe and worsening abdominal  pain.   You have diarrhea and:   You have a rash, stiff neck, or severe headache.   You are irritable, sleepy, or difficult to awaken.   You are weak, dizzy, or extremely thirsty.   You have bright red blood in your stool or you have black tarry stools.   You have unusual abdominal swelling that is painful.   You vomit continuously.   You vomit blood (hematemesis).   You have both abdominal pain and a fever.    This information is not intended to replace advice given to you by your health care provider. Make sure you discuss any questions you have with your health care provider.   Document Released: 10/02/2005 Document Revised: 10/23/2014 Document Reviewed: 06/19/2014 Elsevier Interactive Patient Education 2016 [employer]  

## 2015-08-11 ENCOUNTER — Ambulatory Visit: Payer: BLUE CROSS/BLUE SHIELD | Admitting: Cardiology

## 2015-08-20 ENCOUNTER — Other Ambulatory Visit: Payer: Self-pay | Admitting: Psychiatry

## 2015-08-20 DIAGNOSIS — G43719 Chronic migraine without aura, intractable, without status migrainosus: Secondary | ICD-10-CM

## 2015-08-24 ENCOUNTER — Ambulatory Visit: Payer: BLUE CROSS/BLUE SHIELD | Admitting: Sports Medicine

## 2015-08-29 ENCOUNTER — Ambulatory Visit
Admission: RE | Admit: 2015-08-29 | Discharge: 2015-08-29 | Disposition: A | Discharge status: Home / Self Care | Payer: BLUE CROSS/BLUE SHIELD | Source: Ambulatory Visit | Attending: Psychiatry | Admitting: Psychiatry

## 2015-08-29 DIAGNOSIS — G43719 Chronic migraine without aura, intractable, without status migrainosus: Secondary | ICD-10-CM

## 2015-08-30 ENCOUNTER — Ambulatory Visit (INDEPENDENT_AMBULATORY_CARE_PROVIDER_SITE_OTHER): Payer: BLUE CROSS/BLUE SHIELD | Admitting: Sports Medicine

## 2015-08-30 ENCOUNTER — Encounter: Payer: Self-pay | Admitting: Sports Medicine

## 2015-08-30 VITALS — BP 126/75 | HR 73 | Wt 157.0 lb

## 2015-08-30 DIAGNOSIS — E781 Pure hyperglyceridemia: Secondary | ICD-10-CM | POA: Diagnosis not present

## 2015-08-30 DIAGNOSIS — M5136 Other intervertebral disc degeneration, lumbar region: Secondary | ICD-10-CM

## 2015-08-30 DIAGNOSIS — I10 Essential (primary) hypertension: Secondary | ICD-10-CM | POA: Diagnosis not present

## 2015-08-30 DIAGNOSIS — K580 Irritable bowel syndrome with diarrhea: Secondary | ICD-10-CM

## 2015-08-30 DIAGNOSIS — Z0000 Encounter for general adult medical examination without abnormal findings: Secondary | ICD-10-CM

## 2015-08-30 DIAGNOSIS — M51369 Other intervertebral disc degeneration, lumbar region without mention of lumbar back pain or lower extremity pain: Secondary | ICD-10-CM

## 2015-08-30 DIAGNOSIS — K209 Esophagitis, unspecified without bleeding: Secondary | ICD-10-CM

## 2015-08-30 DIAGNOSIS — Z113 Encounter for screening for infections with a predominantly sexual mode of transmission: Secondary | ICD-10-CM | POA: Insufficient documentation

## 2015-08-30 DIAGNOSIS — G43009 Migraine without aura, not intractable, without status migrainosus: Secondary | ICD-10-CM

## 2015-08-30 MED ORDER — METHOCARBAMOL 500 MG PO TABS: 500.0000 mg | ORAL_TABLET | Freq: Three times a day (TID) | ORAL | Status: DC

## 2015-08-30 MED ORDER — HYDROCHLOROTHIAZIDE 12.5 MG PO TABS: 6.2500 mg | ORAL_TABLET | Freq: Two times a day (BID) | ORAL | Status: DC

## 2015-08-30 MED ORDER — TRAMADOL HCL ER 100 MG PO TB24: 100.0000 mg | ORAL_TABLET | Freq: Every day | ORAL | Status: DC | PRN

## 2015-08-30 NOTE — Assessment & Plan Note (Signed)
Continue Topamax. She did have nonspecific white matter changes that are stable on her most recent MRI, follow-up with neurology is tomorrow.

## 2015-08-30 NOTE — Progress Notes (Signed)
  Subjective:    CC: CPE  HPI:  Preventive measures: Physical exam to be done with blood work  Esophagitis: Continues to take Nexium, still has some pain but also continues to take diclofenac. Agreeable to discontinue this for the time being.  Lumbar degenerative disc disease: Excessive sedation on Flexeril, she also declines an additional epidural at this point. Pain is moderate, persistent, no radiation, no bowel or bladder dysfunction, saddle numbness, or constitutional symptoms.  Hypertriglyceridemia: Due for a recheck.  Hypertension: Needs a refill on hydrochlorothiazide  STD screening: Desires screening for all STDs today.  Migraine headaches: It have fairly significant white matter changes that were stable on her most recent MRI, migraines are well controlled, and she does have a follow-up with neurology tomorrow.  Past medical history, Surgical history, Family history not pertinant except as noted below, Social history, Allergies, and medications have been entered into the medical record, reviewed, and no changes needed.   Review of Systems: No headache, visual changes, nausea, vomiting, diarrhea, constipation, dizziness, abdominal pain, skin rash, fevers, chills, night sweats, swollen lymph nodes, weight loss, chest pain, body aches, joint swelling, muscle aches, shortness of breath, mood changes, visual or auditory hallucinations.  Objective:    General: Well Developed, well nourished, and in no acute distress.  Neuro: Alert and oriented x3, extra-ocular muscles intact, sensation grossly intact. Cranial nerves II through XII are intact, motor, sensory, and coordinative functions are all intact. HEENT: Normocephalic, atraumatic, pupils equal round reactive to light, neck supple, no masses, no lymphadenopathy, thyroid nonpalpable. Oropharynx, nasopharynx, external ear canals are unremarkable. Skin: Warm and dry, no rashes noted.  Cardiac: Regular rate and rhythm, no murmurs rubs  or gallops.  Respiratory: Clear to auscultation bilaterally. Not using accessory muscles, speaking in full sentences.  Abdominal: Soft, mildly tender to palpation in the epigastrium, nondistended, positive bowel sounds, no masses, no organomegaly. No guarding, no rigidity, no rebound tenderness. No costovertebral angle pain. Musculoskeletal: Shoulder, elbow, wrist, hip, knee, ankle stable, and with full range of motion.  Impression and Recommendations:    The patient was counselled, risk factors were discussed, anticipatory guidance given.

## 2015-08-30 NOTE — Assessment & Plan Note (Signed)
Rechecking lipids. 

## 2015-08-30 NOTE — Assessment & Plan Note (Signed)
Screening for all STDs.

## 2015-08-30 NOTE — Assessment & Plan Note (Signed)
Well-controlled on low-dose hydrochlorothiazide, she is breaking the tablet in half and doing 6.25 mg twice a day, we will see if we can get her pharmacy to cut it for her.

## 2015-08-30 NOTE — Assessment & Plan Note (Signed)
Continue Nexium, discontinue diclofenac.

## 2015-08-30 NOTE — Assessment & Plan Note (Signed)
Discontinue diclofenac. Using tramadol switching to extended release, and switching to Robaxin to decrease sedation of Flexeril.

## 2015-08-30 NOTE — Assessment & Plan Note (Signed)
Annual physical as above with routine blood work.

## 2015-09-20 ENCOUNTER — Encounter: Payer: Self-pay | Admitting: Sports Medicine

## 2015-09-21 ENCOUNTER — Encounter: Payer: Self-pay | Admitting: Sports Medicine

## 2015-09-21 DIAGNOSIS — R740 Nonspecific elevation of levels of transaminase and lactic acid dehydrogenase [LDH]: Secondary | ICD-10-CM

## 2015-09-21 DIAGNOSIS — R7401 Elevation of levels of liver transaminase levels: Secondary | ICD-10-CM | POA: Insufficient documentation

## 2015-09-24 ENCOUNTER — Other Ambulatory Visit: Payer: Self-pay | Admitting: Sports Medicine

## 2015-09-24 DIAGNOSIS — E781 Pure hyperglyceridemia: Secondary | ICD-10-CM

## 2015-10-06 ENCOUNTER — Other Ambulatory Visit: Payer: Self-pay | Admitting: Sports Medicine

## 2015-10-26 ENCOUNTER — Encounter: Payer: Self-pay | Admitting: Sports Medicine

## 2015-10-27 MED ORDER — BUTALBITAL-APAP-CAFFEINE 50-325-40 MG PO TABS: 1.0000 | ORAL_TABLET | Freq: Four times a day (QID) | ORAL | Status: DC | PRN

## 2015-10-28 DIAGNOSIS — N83202 Unspecified ovarian cyst, left side: Secondary | ICD-10-CM | POA: Insufficient documentation

## 2015-10-31 ENCOUNTER — Other Ambulatory Visit: Payer: Self-pay | Admitting: Sports Medicine

## 2015-11-29 ENCOUNTER — Encounter: Payer: Self-pay | Admitting: Sports Medicine

## 2015-11-29 MED ORDER — DIAZEPAM 10 MG PO TABS: ORAL_TABLET | ORAL | Status: DC

## 2016-01-07 ENCOUNTER — Encounter: Payer: Self-pay | Admitting: Sports Medicine

## 2016-01-07 MED ORDER — DICLOFENAC SODIUM 75 MG PO TBEC: 75.0000 mg | DELAYED_RELEASE_TABLET | Freq: Two times a day (BID) | ORAL | Status: DC

## 2016-04-28 ENCOUNTER — Encounter: Payer: Self-pay | Admitting: Sports Medicine

## 2016-04-28 ENCOUNTER — Ambulatory Visit (INDEPENDENT_AMBULATORY_CARE_PROVIDER_SITE_OTHER): Payer: BLUE CROSS/BLUE SHIELD | Admitting: Sports Medicine

## 2016-04-28 VITALS — BP 120/74 | HR 76 | Resp 18 | Wt 152.6 lb

## 2016-04-28 DIAGNOSIS — M51369 Other intervertebral disc degeneration, lumbar region without mention of lumbar back pain or lower extremity pain: Secondary | ICD-10-CM

## 2016-04-28 DIAGNOSIS — M5136 Other intervertebral disc degeneration, lumbar region: Secondary | ICD-10-CM

## 2016-04-28 DIAGNOSIS — N2889 Other specified disorders of kidney and ureter: Secondary | ICD-10-CM

## 2016-04-28 MED ORDER — ACETAMINOPHEN ER 650 MG PO TBCR: 1300.0000 mg | EXTENDED_RELEASE_TABLET | Freq: Three times a day (TID) | ORAL | Status: DC | PRN

## 2016-04-28 NOTE — Assessment & Plan Note (Signed)
Recurrence of pain, initially did well after lumbar epidural 2 years ago. Restarting physical therapy, Tylenol.  Return to see me in one month, repeat interventional treatment if no better.

## 2016-04-28 NOTE — Progress Notes (Signed)
  Subjective:    CC: Back pain and kidney mass  HPI: Back pain: History of lumbar degenerative disc disease, initially she did well after a lumbar epidural 2 years ago, she is having recurrence of axial, discogenic pain, agreeable to restart conservative measures including physical therapy and Tylenol.  Kidney mass: Noted on abdominal ultrasound, there was a question of a renal left-sided angiomyolipoma. No confirmatory imaging has yet been done, no fevers, chills, weight loss, night sweats, no hematuria, no abdominal pain.  Past medical history, Surgical history, Family history not pertinant except as noted below, Social history, Allergies, and medications have been entered into the medical record, reviewed, and no changes needed.   Review of Systems: No fevers, chills, night sweats, weight loss, chest pain, or shortness of breath.   Objective:    General: Well Developed, well nourished, and in no acute distress.  Neuro: Alert and oriented x3, extra-ocular muscles intact, sensation grossly intact.  HEENT: Normocephalic, atraumatic, pupils equal round reactive to light, neck supple, no masses, no lymphadenopathy, thyroid nonpalpable.  Skin: Warm and dry, no rashes. Cardiac: Regular rate and rhythm, no murmurs rubs or gallops, no lower extremity edema.  Respiratory: Clear to auscultation bilaterally. Not using accessory muscles, speaking in full sentences. Back Exam:  Inspection: Unremarkable  Motion: Flexion 45 deg, Extension 45 deg, Side Bending to 45 deg bilaterally,  Rotation to 45 deg bilaterally  SLR laying: Negative  XSLR laying: Negative  Palpable tenderness: None. FABER: negative. Sensory change: Gross sensation intact to all lumbar and sacral dermatomes.  Reflexes: 2+ at both patellar tendons, 2+ at achilles tendons, Babinski's downgoing.  Strength at foot  Plantar-flexion: 5/5 Dorsi-flexion: 5/5 Eversion: 5/5 Inversion: 5/5  Leg strength  Quad: 5/5 Hamstring: 5/5 Hip  flexor: 5/5 Hip abductors: 5/5  Gait unremarkable.  Impression and Recommendations:    I spent 25 minutes with this patient, greater than 50% was face-to-face time counseling regarding the above diagnoses

## 2016-04-28 NOTE — Assessment & Plan Note (Signed)
Questionable left renal angiomyolipoma. We do need an utrasound at [facility], we do need confirmatory MRI of the abdomen with and without IV contrast.

## 2016-05-01 ENCOUNTER — Other Ambulatory Visit: Payer: Self-pay | Admitting: Sports Medicine

## 2016-05-01 DIAGNOSIS — I10 Essential (primary) hypertension: Secondary | ICD-10-CM

## 2016-05-12 ENCOUNTER — Ambulatory Visit
Admission: RE | Admit: 2016-05-12 | Discharge: 2016-05-12 | Disposition: A | Discharge status: Home / Self Care | Payer: BLUE CROSS/BLUE SHIELD | Source: Ambulatory Visit | Attending: Sports Medicine | Admitting: Sports Medicine

## 2016-05-12 DIAGNOSIS — N2889 Other specified disorders of kidney and ureter: Secondary | ICD-10-CM

## 2016-05-12 MED ORDER — GADOBENATE DIMEGLUMINE 529 MG/ML IV SOLN
14.0000 mL | Freq: Once | INTRAVENOUS | Status: AC | PRN
  Administered 2016-05-12: 14 mL via INTRAVENOUS

## 2016-05-16 ENCOUNTER — Encounter: Payer: Self-pay | Admitting: Sports Medicine

## 2016-07-11 ENCOUNTER — Encounter: Payer: Self-pay | Admitting: Sports Medicine

## 2016-07-11 ENCOUNTER — Other Ambulatory Visit: Payer: Self-pay

## 2016-07-11 DIAGNOSIS — I10 Essential (primary) hypertension: Secondary | ICD-10-CM

## 2016-07-11 MED ORDER — HYDROCHLOROTHIAZIDE 12.5 MG PO TABS: 6.2500 mg | ORAL_TABLET | Freq: Two times a day (BID) | ORAL | 0 refills | Status: DC

## 2016-07-11 MED ORDER — IRBESARTAN 150 MG PO TABS: 150.0000 mg | ORAL_TABLET | Freq: Two times a day (BID) | ORAL | 0 refills | Status: DC

## 2016-07-11 MED ORDER — DICLOFENAC SODIUM 75 MG PO TBEC: 75.0000 mg | DELAYED_RELEASE_TABLET | Freq: Two times a day (BID) | ORAL | 0 refills | Status: AC

## 2016-08-18 ENCOUNTER — Encounter: Payer: Self-pay | Admitting: Sports Medicine

## 2016-08-18 ENCOUNTER — Encounter: Payer: BLUE CROSS/BLUE SHIELD | Admitting: Physician Assistant

## 2016-08-18 DIAGNOSIS — M5136 Other intervertebral disc degeneration, lumbar region: Secondary | ICD-10-CM

## 2016-08-18 DIAGNOSIS — M51369 Other intervertebral disc degeneration, lumbar region without mention of lumbar back pain or lower extremity pain: Secondary | ICD-10-CM

## 2016-08-18 NOTE — Progress Notes (Deleted)
Cardiology Office Note    Date:  08/18/2016   ID:  Kenlee Salem SenateMaldonado Johnston, DOB 01/21/1976, MRN [id number]  PCP:  Rodney LangtonHEKKEKANDAM, THOMAS, MD  Cardiologist:  Dr. Jens Somrenshaw  Chief Complaint  Patient presents with  . Follow-up    seen for Dr. Jens Somrenshaw    History of Present Illness:  Debbie Johnston is a 40 y.o. female with PMH of HTN and palpitation. The patient was last seen by Dr. Jens Somrenshaw on 05/12/2015 at which time she was having intermittent sudden onset of palpitation lasting 10 seconds or so. The symptom was actually improving, therefore an outpatient echocardiogram was recommended, if the symptoms does recur again, we can potentially consider event monitor. It does not appear patient has had the echocardiogram that was recommended.    Past Medical History:  Diagnosis Date  . Esophagitis   . Hypertension     Past Surgical History:  Procedure Laterality Date  . gallbladder removed  2005  . ovarian cyst removed  2007    Current Medications: Outpatient Medications Prior to Visit  Medication Sig Dispense Refill  . acetaminophen (TYLENOL) 650 MG CR tablet Take 2 tablets (1,300 mg total) by mouth every 8 (eight) hours as needed for pain. 90 tablet 3  . butalbital-acetaminophen-caffeine (FIORICET) 50-325-40 MG tablet Take 1-2 tablets by mouth every 6 (six) hours as needed for headache. 20 tablet 0  . diazepam (VALIUM) 10 MG tablet One tab by mouth, 1 hours before event. 10 tablet 0  . diclofenac (VOLTAREN) 75 MG EC tablet Take 1 tablet (75 mg total) by mouth 2 (two) times daily. 180 tablet 0  . esomeprazole (NEXIUM) 40 MG capsule One tab by mouth at dinner time.    . hydrochlorothiazide (HYDRODIURIL) 12.5 MG tablet Take 0.5 tablets (6.25 mg total) by mouth 2 (two) times daily. 90 tablet 0  . irbesartan (AVAPRO) 150 MG tablet Take 1 tablet (150 mg total) by mouth 2 (two) times daily. 180 tablet 0  . methocarbamol (ROBAXIN) 500 MG tablet Take 1 tablet (500 mg  total) by mouth 3 (three) times daily. 90 tablet 0  . Norgestimate-Ethinyl Estradiol Triphasic 0.18/0.215/0.25 MG-35 MCG tablet     . psyllium (METAMUCIL SMOOTH TEXTURE) 58.6 % powder Take 1 packet by mouth 3 (three) times daily. 283 g 12  . topiramate (TOPAMAX) 50 MG tablet TAKE 1 TABLET DAILY 90 tablet 0  . traMADol (ULTRAM-ER) 100 MG 24 hr tablet Take 1 tablet (100 mg total) by mouth daily as needed for pain. 30 tablet 2   No facility-administered medications prior to visit.      Allergies:   Cephalosporins and Quinolones   Social History   Social History  . Marital status: Married    Spouse name: N/A  . Number of children: 1  . Years of education: N/A   Social History Main Topics  . Smoking status: Never Smoker  . Smokeless tobacco: Not on file  . Alcohol use No  . Drug use: No  . Sexual activity: Yes   Other Topics Concern  . Not on file   Social History Narrative  . No narrative on file     Family History:  The patient's ***family history includes Hypertension in her mother.   ROS:   Please see the history of present illness.    ROS All other systems reviewed and are negative.   PHYSICAL EXAM:   VS:  There were no vitals taken for this visit.   GEN: Well nourished, well developed,  in no acute distress  HEENT: normal  Neck: no JVD, carotid bruits, or masses Cardiac: ***RRR; no murmurs, rubs, or gallops,no edema  Respiratory:  clear to auscultation bilaterally, normal work of breathing GI: soft, nontender, nondistended, + BS MS: no deformity or atrophy  Skin: warm and dry, no rash Neuro:  Alert and Oriented x 3, Strength and sensation are intact Psych: euthymic mood, full affect  Wt Readings from Last 3 Encounters:  04/28/16 152 lb 9.6 oz (69.2 kg)  08/30/15 157 lb (71.2 kg)  07/27/15 158 lb (71.7 kg)      Studies/Labs Reviewed:   EKG:  EKG is*** ordered today.  The ekg ordered today demonstrates ***  Recent Labs: No results found for requested  labs within last 8760 hours.   Lipid Panel    Component Value Date/Time   CHOL 184 07/15/2013 0813   TRIG 213 (H) 07/15/2013 0813   HDL 38 (L) 07/15/2013 0813   CHOLHDL 4.8 07/15/2013 0813   VLDL 43 (H) 07/15/2013 0813   LDLCALC 103 (H) 07/15/2013 0813    Additional studies/ records that were reviewed today include:  ***    ASSESSMENT:    No diagnosis found.   PLAN:  In order of problems listed above:  1. ***    Medication Adjustments/Labs and Tests Ordered: Current medicines are reviewed at length with the patient today.  Concerns regarding medicines are outlined above.  Medication changes, Labs and Tests ordered today are listed in the Patient Instructions below. There are no Patient Instructions on file for this visit.   Ramond DialSigned, Wyett Narine, [place]  08/18/2016 5:47 AM    [facility] [address], [place], [place]  [address] Phone: [phone]; Fax: [phone]    This encounter was created in error - please disregard.

## 2016-08-21 NOTE — Progress Notes (Signed)
error 

## 2016-09-06 ENCOUNTER — Other Ambulatory Visit: Payer: Self-pay | Admitting: *Deleted

## 2016-09-06 DIAGNOSIS — I10 Essential (primary) hypertension: Secondary | ICD-10-CM

## 2016-09-06 MED ORDER — HYDROCHLOROTHIAZIDE 12.5 MG PO TABS: 6.2500 mg | ORAL_TABLET | Freq: Two times a day (BID) | ORAL | 0 refills | Status: DC

## 2016-09-19 ENCOUNTER — Other Ambulatory Visit: Payer: Self-pay | Admitting: Neurosurgery

## 2016-09-19 DIAGNOSIS — M5136 Other intervertebral disc degeneration, lumbar region: Secondary | ICD-10-CM

## 2016-09-25 ENCOUNTER — Other Ambulatory Visit: Payer: Self-pay | Admitting: Sports Medicine

## 2016-09-25 ENCOUNTER — Telehealth: Payer: Self-pay | Admitting: Sports Medicine

## 2016-09-25 DIAGNOSIS — I10 Essential (primary) hypertension: Secondary | ICD-10-CM

## 2016-09-25 NOTE — Telephone Encounter (Signed)
I called pt and left a message to let her know she is due for a F/u on her BP meds with Dr.Thekkekandam in order to get more meds refill

## 2016-10-03 ENCOUNTER — Ambulatory Visit
Admission: RE | Admit: 2016-10-03 | Discharge: 2016-10-03 | Disposition: A | Discharge status: Home / Self Care | Payer: BLUE CROSS/BLUE SHIELD | Source: Ambulatory Visit | Attending: Neurosurgery | Admitting: Neurosurgery

## 2016-10-03 VITALS — BP 132/73 | HR 62

## 2016-10-03 DIAGNOSIS — M5136 Other intervertebral disc degeneration, lumbar region: Secondary | ICD-10-CM

## 2016-10-03 DIAGNOSIS — M5412 Radiculopathy, cervical region: Secondary | ICD-10-CM

## 2016-10-03 MED ORDER — IOPAMIDOL (ISOVUE-M 300) INJECTION 61%
10.0000 mL | Freq: Once | INTRAMUSCULAR | Status: AC | PRN
  Administered 2016-10-03: 10 mL via INTRATHECAL

## 2016-10-03 MED ORDER — MEPERIDINE HCL 100 MG/ML IJ SOLN
75.0000 mg | Freq: Once | INTRAMUSCULAR | Status: AC
  Administered 2016-10-03: 75 mg via INTRAMUSCULAR

## 2016-10-03 MED ORDER — ONDANSETRON HCL 4 MG/2ML IJ SOLN
4.0000 mg | Freq: Once | INTRAMUSCULAR | Status: AC
  Administered 2016-10-03: 4 mg via INTRAMUSCULAR

## 2016-10-03 MED ORDER — DIAZEPAM 5 MG PO TABS
10.0000 mg | ORAL_TABLET | Freq: Once | ORAL | Status: AC
  Administered 2016-10-03: 5 mg via ORAL

## 2016-10-03 NOTE — Discharge Instructions (Signed)

## 2016-10-05 ENCOUNTER — Telehealth: Payer: Self-pay | Admitting: Radiology

## 2016-10-05 NOTE — Telephone Encounter (Signed)
Pt states she does have  Some pressure in her head and she continues to drink fluids and rest.

## 2016-10-10 ENCOUNTER — Other Ambulatory Visit: Payer: Self-pay | Admitting: Sports Medicine

## 2016-10-18 ENCOUNTER — Ambulatory Visit (INDEPENDENT_AMBULATORY_CARE_PROVIDER_SITE_OTHER): Payer: BLUE CROSS/BLUE SHIELD

## 2016-10-18 ENCOUNTER — Ambulatory Visit (INDEPENDENT_AMBULATORY_CARE_PROVIDER_SITE_OTHER): Payer: BLUE CROSS/BLUE SHIELD | Admitting: Sports Medicine

## 2016-10-18 ENCOUNTER — Encounter: Payer: Self-pay | Admitting: Sports Medicine

## 2016-10-18 DIAGNOSIS — E042 Nontoxic multinodular goiter: Secondary | ICD-10-CM

## 2016-10-18 DIAGNOSIS — Z0000 Encounter for general adult medical examination without abnormal findings: Secondary | ICD-10-CM | POA: Diagnosis not present

## 2016-10-18 DIAGNOSIS — R740 Nonspecific elevation of levels of transaminase and lactic acid dehydrogenase [LDH]: Secondary | ICD-10-CM

## 2016-10-18 DIAGNOSIS — E079 Disorder of thyroid, unspecified: Secondary | ICD-10-CM | POA: Diagnosis not present

## 2016-10-18 DIAGNOSIS — R7401 Elevation of levels of liver transaminase levels: Secondary | ICD-10-CM

## 2016-10-18 NOTE — Assessment & Plan Note (Signed)
Incidentally noted on CT myelogram were multiple hypoattenuating nodules in the thyroid. Checking thyroid function, as well as thyroid ultrasound. Exam is benign, I do not feel any thyroid masses.

## 2016-10-18 NOTE — Assessment & Plan Note (Signed)
Also due for routine screening blood work. Checking lipid panel, other routine blood work, she has a recent CMP that was normal from an outside source.

## 2016-10-18 NOTE — Assessment & Plan Note (Signed)
Resolved on recent CMP from an outside source.

## 2016-10-18 NOTE — Progress Notes (Signed)
  Subjective:    CC: Follow-up thyroid nodule  HPI: This is a pleasant 41 year old female, she got a CT myelogram my her neurosurgeon, incidentally noted were multiple hypodense thyroid nodules. She is referred back to me for further evaluation and further workup. She denies any symptoms hyperthyroidism or hypothyroidism, she is unable to feel any masses herself, no constitutional symptoms.  Past medical history:  Negative.  See flowsheet/record as well for more information.  Surgical history: Negative.  See flowsheet/record as well for more information.  Family history: Negative.  See flowsheet/record as well for more information.  Social history: Negative.  See flowsheet/record as well for more information.  Allergies, and medications have been entered into the medical record, reviewed, and no changes needed.   Review of Systems: No fevers, chills, night sweats, weight loss, chest pain, or shortness of breath.   Objective:    General: Well Developed, well nourished, and in no acute distress.  Neuro: Alert and oriented x3, extra-ocular muscles intact, sensation grossly intact.  HEENT: Normocephalic, atraumatic, pupils equal round reactive to light, neck supple, no masses, no lymphadenopathy, thyroid nonpalpable, no palpable masses or nodules. Oropharynx, nasopharynx, ear canals unremarkable. Skin: Warm and dry, no rashes. Cardiac: Regular rate and rhythm, no murmurs rubs or gallops, no lower extremity edema.  Respiratory: Clear to auscultation bilaterally. Not using accessory muscles, speaking in full sentences.  Impression and Recommendations:    Thyroid mass Incidentally noted on CT myelogram were multiple hypoattenuating nodules in the thyroid. Checking thyroid function, as well as thyroid ultrasound. Exam is benign, I do not feel any thyroid masses.  Annual physical exam Also due for routine screening blood work. Checking lipid panel, other routine blood work, she has a recent  CMP that was normal from an outside source.  Transaminitis Resolved on recent CMP from an outside source.

## 2016-10-19 ENCOUNTER — Encounter: Payer: Self-pay | Admitting: Sports Medicine

## 2016-10-20 LAB — LIPID PANEL
Cholesterol: 162 mg/dL (ref 0–200)
HDL: 40 mg/dL (ref 35–70)
LDL Cholesterol: 93 mg/dL
TRIGLYCERIDES: 203 mg/dL — AB (ref 40–160)

## 2016-10-20 LAB — CBC AND DIFFERENTIAL
HEMATOCRIT: 40 % (ref 36–46)
Hemoglobin: 13.9 g/dL (ref 12.0–16.0)
NEUTROS ABS: 3102 /uL
Platelets: 225 10*3/uL (ref 150–399)
WBC: 5.5 10*3/mL

## 2016-10-20 LAB — HEMOGLOBIN A1C: Hemoglobin A1C: 5.4

## 2016-10-20 LAB — TSH: TSH: 0.87 u[IU]/mL (ref 0.41–5.90)

## 2016-10-20 LAB — VITAMIN D 25 HYDROXY (VIT D DEFICIENCY, FRACTURES): VIT D 25 HYDROXY: 35

## 2016-10-31 ENCOUNTER — Encounter: Payer: Self-pay | Admitting: Sports Medicine

## 2016-11-08 ENCOUNTER — Encounter: Payer: Self-pay | Admitting: Sports Medicine

## 2016-11-10 MED ORDER — BUTALBITAL-APAP-CAFFEINE 50-325-40 MG PO TABS: 1.0000 | ORAL_TABLET | Freq: Four times a day (QID) | ORAL | 0 refills | Status: DC | PRN

## 2016-11-10 MED ORDER — DIAZEPAM 10 MG PO TABS: ORAL_TABLET | ORAL | 0 refills | Status: DC

## 2016-12-11 ENCOUNTER — Other Ambulatory Visit: Payer: Self-pay | Admitting: Sports Medicine

## 2016-12-11 DIAGNOSIS — R5381 Other malaise: Secondary | ICD-10-CM

## 2016-12-11 DIAGNOSIS — Z1231 Encounter for screening mammogram for malignant neoplasm of breast: Secondary | ICD-10-CM

## 2016-12-13 ENCOUNTER — Ambulatory Visit
Admission: RE | Admit: 2016-12-13 | Discharge: 2016-12-13 | Disposition: A | Discharge status: Home / Self Care | Payer: BLUE CROSS/BLUE SHIELD | Source: Ambulatory Visit | Attending: Sports Medicine | Admitting: Sports Medicine

## 2016-12-13 DIAGNOSIS — Z1231 Encounter for screening mammogram for malignant neoplasm of breast: Secondary | ICD-10-CM

## 2017-01-01 ENCOUNTER — Encounter: Payer: Self-pay | Admitting: Sports Medicine

## 2017-01-20 ENCOUNTER — Other Ambulatory Visit: Payer: Self-pay | Admitting: Sports Medicine

## 2017-01-20 DIAGNOSIS — I10 Essential (primary) hypertension: Secondary | ICD-10-CM

## 2017-01-29 ENCOUNTER — Encounter: Payer: Self-pay | Admitting: Sports Medicine

## 2017-02-11 ENCOUNTER — Other Ambulatory Visit: Payer: Self-pay | Admitting: Sports Medicine

## 2017-02-11 DIAGNOSIS — I10 Essential (primary) hypertension: Secondary | ICD-10-CM

## 2017-06-05 ENCOUNTER — Ambulatory Visit (INDEPENDENT_AMBULATORY_CARE_PROVIDER_SITE_OTHER): Payer: BLUE CROSS/BLUE SHIELD | Admitting: Sports Medicine

## 2017-06-05 ENCOUNTER — Encounter: Payer: Self-pay | Admitting: Sports Medicine

## 2017-06-05 DIAGNOSIS — G43009 Migraine without aura, not intractable, without status migrainosus: Secondary | ICD-10-CM | POA: Diagnosis not present

## 2017-06-05 MED ORDER — RIZATRIPTAN BENZOATE 5 MG PO TBDP: 10.0000 mg | ORAL_TABLET | ORAL | 3 refills | Status: DC | PRN

## 2017-06-05 MED ORDER — PROMETHAZINE HCL 25 MG/ML IJ SOLN
25.0000 mg | Freq: Once | INTRAMUSCULAR | Status: AC
  Administered 2017-06-05: 25 mg via INTRAMUSCULAR

## 2017-06-05 MED ORDER — DEXAMETHASONE SODIUM PHOSPHATE 4 MG/ML IJ SOLN
4.0000 mg | Freq: Once | INTRAMUSCULAR | Status: AC
  Administered 2017-06-05: 4 mg via INTRAMUSCULAR

## 2017-06-05 MED ORDER — KETOROLAC TROMETHAMINE 30 MG/ML IJ SOLN
30.0000 mg | Freq: Once | INTRAMUSCULAR | Status: AC
  Administered 2017-06-05: 30 mg via INTRAMUSCULAR

## 2017-06-05 NOTE — Progress Notes (Signed)
  Subjective:    CC: Headache  HPI: This is a pleasant 41 year old female with a history of migraines, for the past day and a half she's had a throbbing headache with photophobia, no phonophobia or nausea, no aura. No localizing neurologic symptoms. No head trauma, no constitutional symptoms. Unfortunately she discontinued her Topamax sometime ago. She has tried some ibuprofen, Aleve without much success, and she really doesn't have any abortive medications. Pain is severe, persistent.  Past medical history:  Negative.  See flowsheet/record as well for more information.  Surgical history: Negative.  See flowsheet/record as well for more information.  Family history: Negative.  See flowsheet/record as well for more information.  Social history: Negative.  See flowsheet/record as well for more information.  Allergies, and medications have been entered into the medical record, reviewed, and no changes needed.   Review of Systems: No fevers, chills, night sweats, weight loss, chest pain, or shortness of breath.   Objective:    General: Well Developed, well nourished, and in no acute distress.  Neuro: Alert and oriented x3, extra-ocular muscles intact, sensation grossly intact. Cranial nerves II through XII are intact, motor, sensory, and coordinative functions are all intact. HEENT: Normocephalic, atraumatic, pupils equal round reactive to light, neck supple, no masses, no lymphadenopathy, thyroid nonpalpable. Oropharynx, nasal fax, ear canals unremarkable. Skin: Warm and dry, no rashes. Cardiac: Regular rate and rhythm, no murmurs rubs or gallops, no lower extremity edema.  Respiratory: Clear to auscultation bilaterally. Not using accessory muscles, speaking in full sentences.  Toradol 30 mg, Phenergan 25, Decadron 4 mg given intramuscular.  Impression and Recommendations:    Migraine headache Currently having a migraine. Toradol 30, Phenergan 25, Decadron 4 intramuscular. Maxalt to use  at home, return in 2 weeks. If persistent headaches we will consider preventative measures, she was on Topamax 25, we can restart this as she did discontinue herself.

## 2017-06-05 NOTE — Assessment & Plan Note (Signed)
Currently having a migraine. Toradol 30, Phenergan 25, Decadron 4 intramuscular. Maxalt to use at home, return in 2 weeks. If persistent headaches we will consider preventative measures, she was on Topamax 25, we can restart this as she did discontinue herself.

## 2017-06-19 ENCOUNTER — Ambulatory Visit: Payer: BLUE CROSS/BLUE SHIELD | Admitting: Sports Medicine

## 2017-06-26 ENCOUNTER — Other Ambulatory Visit: Payer: Self-pay | Admitting: Sports Medicine

## 2017-06-26 DIAGNOSIS — I10 Essential (primary) hypertension: Secondary | ICD-10-CM

## 2017-07-04 IMAGING — MR MR HEAD W/O CM
9 series · 48 of 48 positions shown · non-contrast
Comparison: 07/31/2014

CLINICAL DATA: Chronic migraines, improved in the past year on
medication.

EXAM:
MRI HEAD WITHOUT CONTRAST
TECHNIQUE: Multiplanar, multiecho pulse sequences of the brain and surrounding
structures were obtained without intravenous contrast.

[Series 5: T1 · sagittal · 4.0mm · 0.75mm/px · 2 of 29 slices shown (1 of 2)]
[im 1/29]
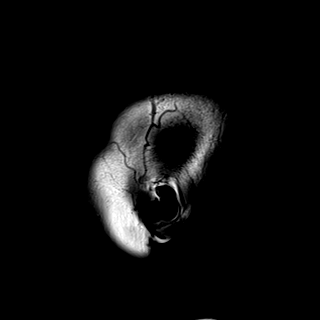
[im 29/29]
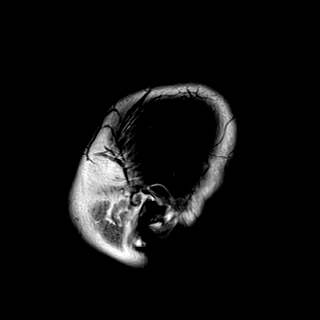

[Series 6: DWI · axial · 3.0mm · 1.44mm/px · z∈[-69,+79]mm · 8 of 92 slices shown (1 of 2)]
[im 1/92]
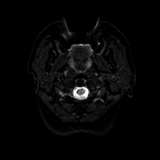
[im 14/92]
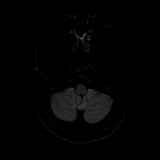
[im 27/92]
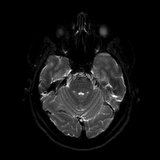
[im 40/92]
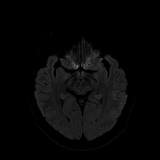
[im 53/92]
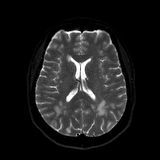
[im 66/92]
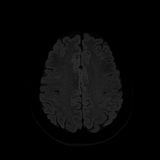
[im 79/92]
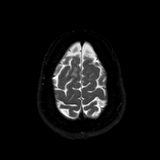
[im 92/92]
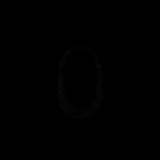

[Series 7: DWI · axial · 3.0mm · 1.44mm/px · z∈[-69,+79]mm · 4 of 46 slices shown (2 of 2)]
[im 1/46]
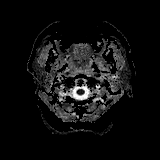
[im 16/46]
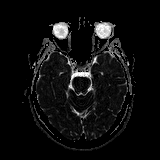
[im 31/46]
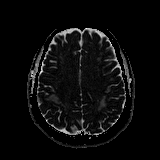
[im 46/46]
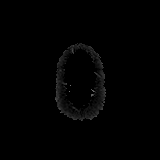

[Series 8: T2 · axial · 4.0mm · 0.36mm/px · z∈[-70,+80]mm · 3 of 30 slices shown (1 of 2)]
[im 1/30]
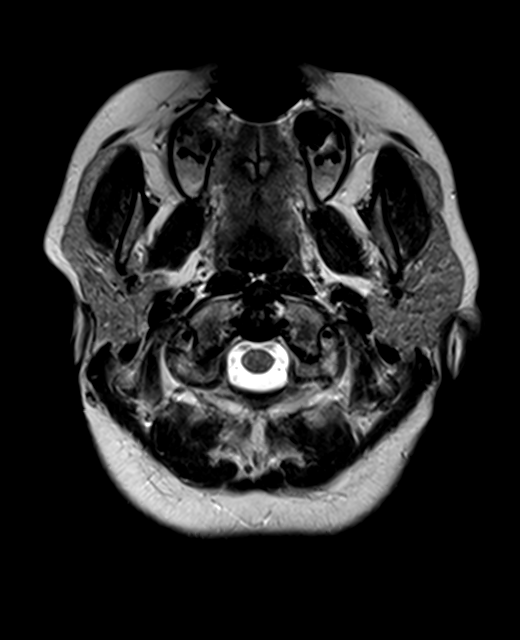
[im 15/30]
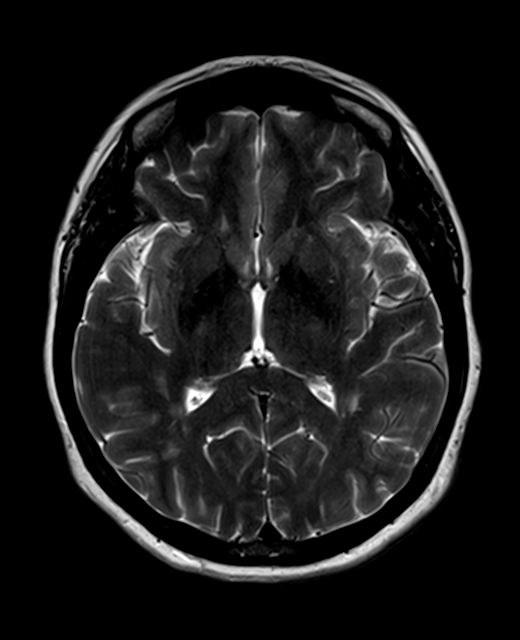
[im 30/30]
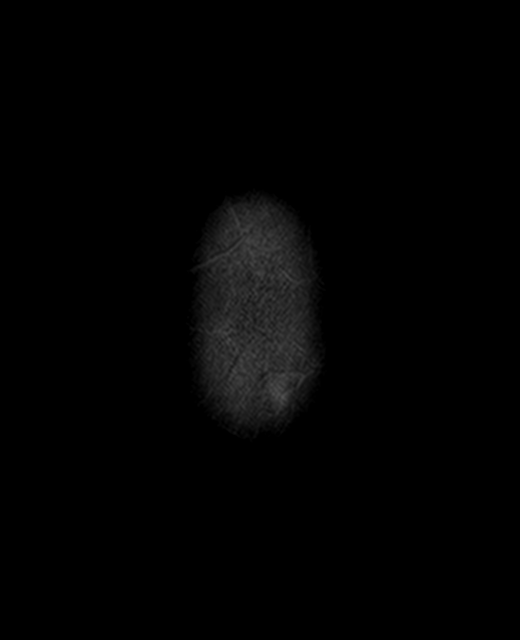

[Series 9: FLAIR · axial · 4.0mm · 0.72mm/px · z∈[-70,+81]mm · 3 of 30 slices shown (1 of 2)]
[im 1/30]
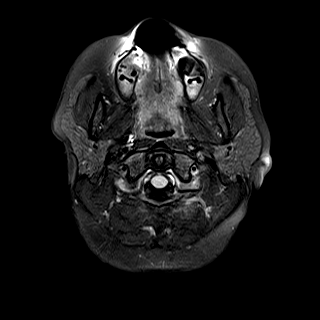
[im 15/30]
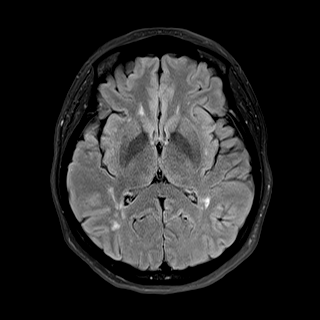
[im 30/30]
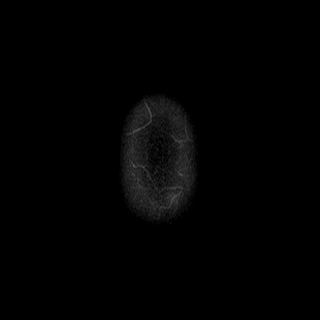

[Series 11: swi_images · axial · 1.5mm · 0.90mm/px · z∈[-66,+76]mm · 9 of 96 slices shown]
[im 1/96]
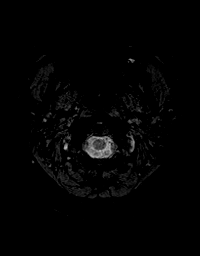
[im 12/96]
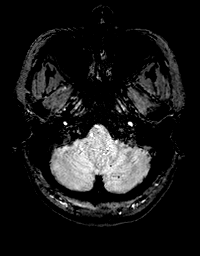
[im 24/96]
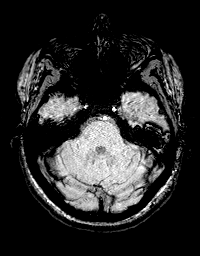
[im 36/96]
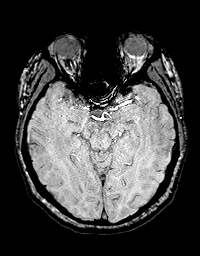
[im 48/96]
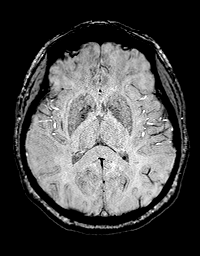
[im 60/96]
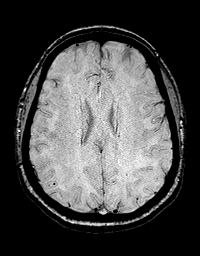
[im 72/96]
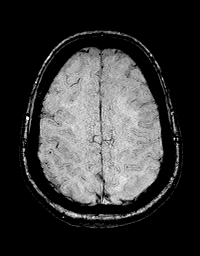
[im 84/96]
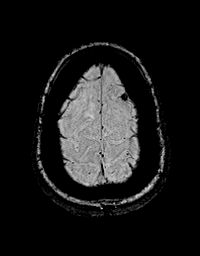
[im 96/96]
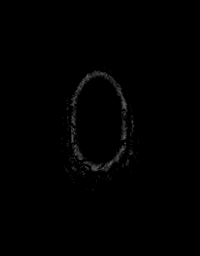

[Series 12: T1 · axial · 1.0mm · 0.90mm/px · z∈[-66,+76]mm · 13 of 144 slices shown (2 of 2)]
[im 1/144]
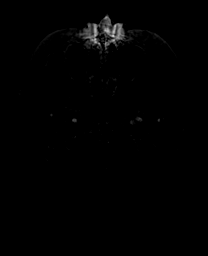
[im 12/144]
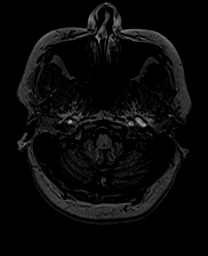
[im 24/144]
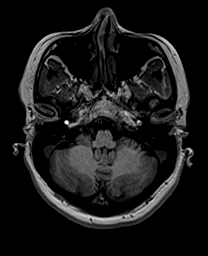
[im 36/144]
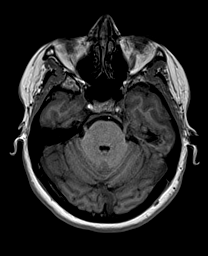
[im 48/144]
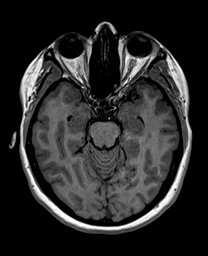
[im 60/144]
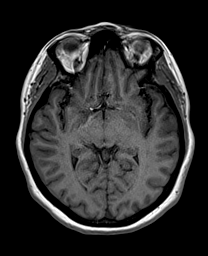
[im 72/144]
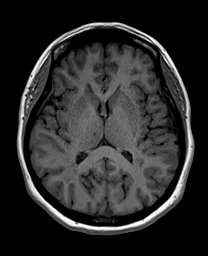
[im 84/144]
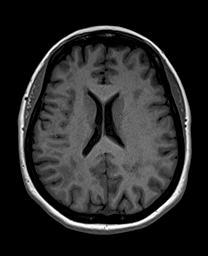
[im 96/144]
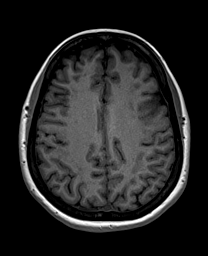
[im 108/144]
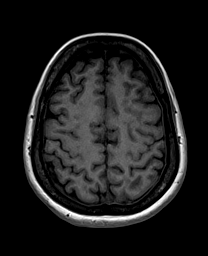
[im 120/144]
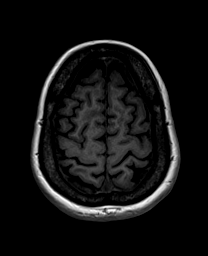
[im 132/144]
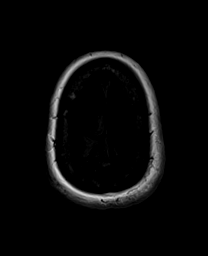
[im 144/144]
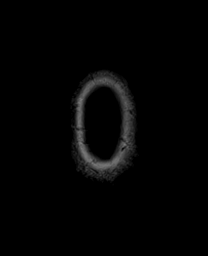

[Series 13: T2 · coronal · 4.5mm · 0.36mm/px · 3 of 28 slices shown (2 of 2)]
[im 1/28]
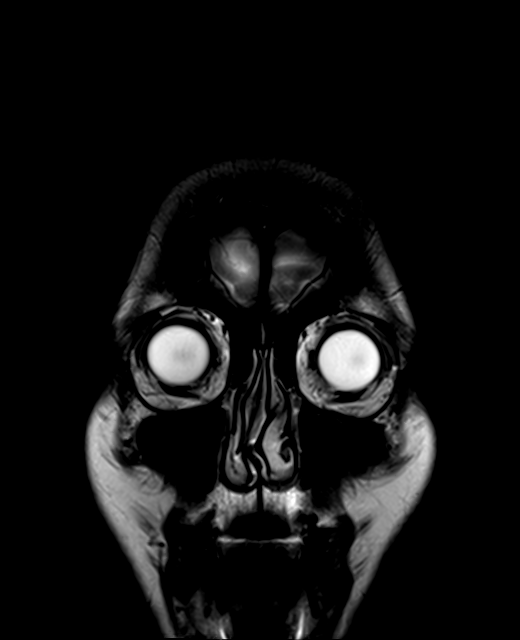
[im 14/28]
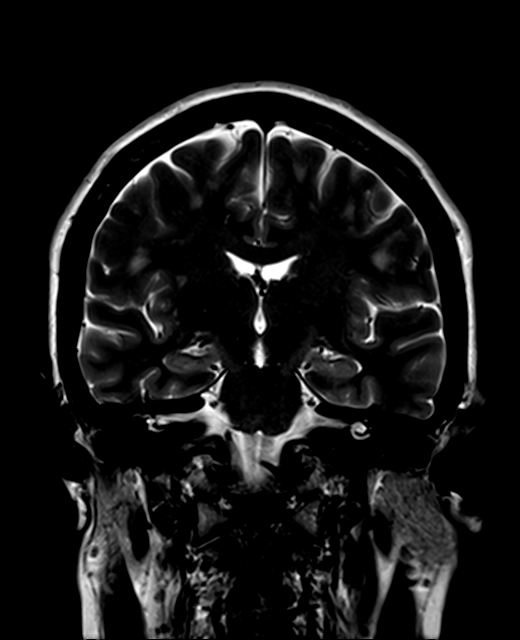
[im 28/28]
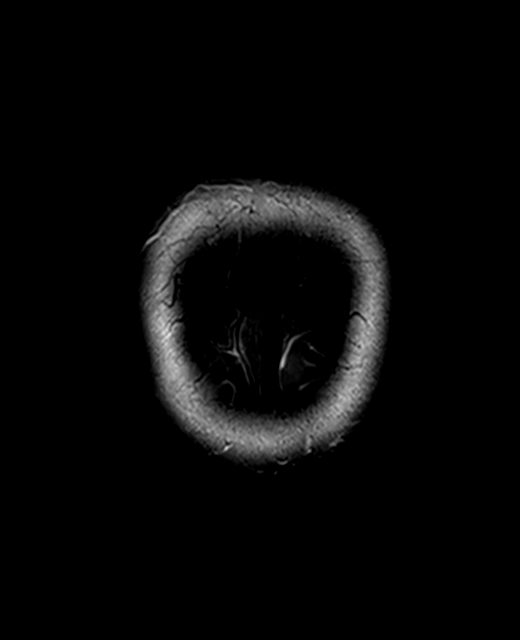

[Series 14: FLAIR · sagittal · 4.0mm · 0.72mm/px · 3 of 29 slices shown (2 of 2)]
[im 1/29]
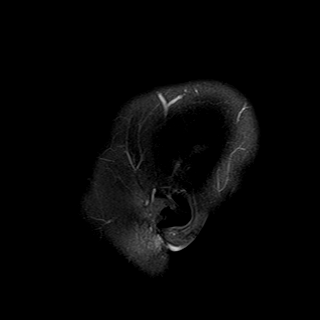
[im 15/29]
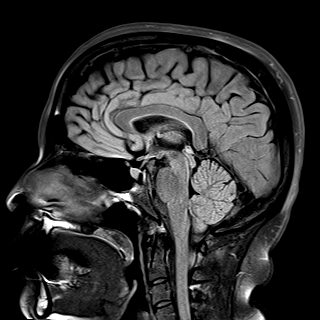
[im 29/29]
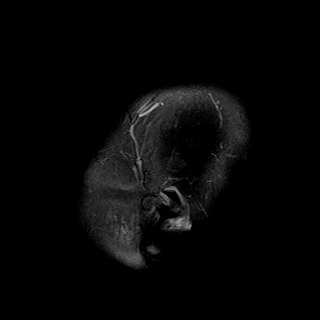

[48 of 48 positions shown; findings below may reference images not displayed]

FINDINGS: There is no evidence of acute infarct, intracranial hemorrhage,
mass, midline shift, or extra-axial fluid collection. Ventricles and
sulci are normal. Patchy T2 hyperintensities involving the
predominantly subcortical cerebral white matter have not
significantly changed and are again markedly abnormal for age. The
brainstem and cerebellum are unremarkable.

Orbits are unremarkable. Paranasal sinuses and mastoid air cells are
clear. Major intracranial vascular flow voids are preserved.
IMPRESSION: 1. No acute intracranial abnormality or mass.
2. Unchanged cerebral white matter disease, markedly abnormal for
age and nonspecific. Considerations include chronic small vessel
ischemia, sequelae of trauma, hypercoagulable state, vasculitis,
migraines, prior infection and demyelination.

## 2017-07-09 ENCOUNTER — Encounter: Payer: BLUE CROSS/BLUE SHIELD | Admitting: Sports Medicine

## 2017-07-13 ENCOUNTER — Ambulatory Visit (INDEPENDENT_AMBULATORY_CARE_PROVIDER_SITE_OTHER): Payer: BLUE CROSS/BLUE SHIELD | Admitting: Sports Medicine

## 2017-07-13 DIAGNOSIS — K580 Irritable bowel syndrome with diarrhea: Secondary | ICD-10-CM | POA: Diagnosis not present

## 2017-07-13 DIAGNOSIS — Z0000 Encounter for general adult medical examination without abnormal findings: Secondary | ICD-10-CM

## 2017-07-13 DIAGNOSIS — I10 Essential (primary) hypertension: Secondary | ICD-10-CM

## 2017-07-13 DIAGNOSIS — G43009 Migraine without aura, not intractable, without status migrainosus: Secondary | ICD-10-CM | POA: Diagnosis not present

## 2017-07-13 MED ORDER — TOPIRAMATE 50 MG PO TABS: ORAL_TABLET | ORAL | 3 refills | Status: DC

## 2017-07-13 MED ORDER — DIAZEPAM 5 MG PO TABS: ORAL_TABLET | ORAL | 0 refills | Status: DC

## 2017-07-13 MED ORDER — RIZATRIPTAN BENZOATE 5 MG PO TBDP: 10.0000 mg | ORAL_TABLET | ORAL | 3 refills | Status: DC | PRN

## 2017-07-13 NOTE — Assessment & Plan Note (Signed)
Having a bit of increased work stress, IBS is not surprisingly worsening.

## 2017-07-13 NOTE — Assessment & Plan Note (Signed)
Well controlled, no changes 

## 2017-07-13 NOTE — Assessment & Plan Note (Signed)
Increasing headaches, refilling Maxalt, would like to restart Topamax.

## 2017-07-13 NOTE — Assessment & Plan Note (Signed)
Annual physical exam as above. 

## 2017-07-13 NOTE — Progress Notes (Signed)
  Subjective:    CC: Annual physical   HPI:  This is a pleasant 41 year old female here for her routine physical, she has no completes with the exception of an increase in her migraines. She does know that when she is more stressed out at work her migraines increase. She stopped Topamax sometime ago but is eager to get back on it. She also needs a refill of her Maxalt.  Past medical history:  Negative.  See flowsheet/record as well for more information.  Surgical history: Negative.  See flowsheet/record as well for more information.  Family history: Negative.  See flowsheet/record as well for more information.  Social history: Negative.  See flowsheet/record as well for more information.  Allergies, and medications have been entered into the medical record, reviewed, and no changes needed.    Review of Systems: No headache, visual changes, nausea, vomiting, diarrhea, constipation, dizziness, abdominal pain, skin rash, fevers, chills, night sweats, swollen lymph nodes, weight loss, chest pain, body aches, joint swelling, muscle aches, shortness of breath, mood changes, visual or auditory hallucinations.  Objective:    General: Well Developed, well nourished, and in no acute distress.  Neuro: Alert and oriented x3, extra-ocular muscles intact, sensation grossly intact. Cranial nerves II through XII are intact, motor, sensory, and coordinative functions are all intact. HEENT: Normocephalic, atraumatic, pupils equal round reactive to light, neck supple, no masses, no lymphadenopathy, thyroid nonpalpable. Oropharynx, nasopharynx, external ear canals are unremarkable. Skin: Warm and dry, no rashes noted. There are a few tiny skin tags around the neck Cardiac: Regular rate and rhythm, no murmurs rubs or gallops.  Respiratory: Clear to auscultation bilaterally. Not using accessory muscles, speaking in full sentences.  Abdominal: Soft, nontender, nondistended, positive bowel sounds, no masses, no  organomegaly.  Musculoskeletal: Shoulder, elbow, wrist, hip, knee, ankle stable, and with full range of motion.  Impression and Recommendations:    The patient was counselled, risk factors were discussed, anticipatory guidance given.  Annual physical exam Annual physical exam as above.  Essential hypertension, benign Well-controlled, no changes.  Irritable bowel syndrome with diarrhea Having a bit of increased work stress, IBS is not surprisingly worsening.  Migraine headache Increasing headaches, refilling Maxalt, would like to restart Topamax.  ___________________________________________ Ihor Austin. Benjamin Stain, M.D., ABFM., CAQSM. Primary Care and Sports Medicine [facility]  Adjunct Instructor of Family Medicine  [employer]

## 2017-07-17 LAB — CBC
HCT: 39.1 % (ref 35.0–45.0)
Hemoglobin: 13.1 g/dL (ref 11.7–15.5)
MCH: 30 pg (ref 27.0–33.0)
MCHC: 33.5 g/dL (ref 32.0–36.0)
MCV: 89.7 fL (ref 80.0–100.0)
MPV: 10.8 fL (ref 7.5–12.5)
Platelets: 235 10*3/uL (ref 140–400)
RBC: 4.36 10*6/uL (ref 3.80–5.10)
RDW: 12 % (ref 11.0–15.0)
WBC: 6.8 Thousand/uL (ref 3.8–10.8)

## 2017-07-17 LAB — COMPREHENSIVE METABOLIC PANEL
AG Ratio: 1.5 (calc) (ref 1.0–2.5)
Albumin: 4 g/dL (ref 3.6–5.1)
Alkaline phosphatase (APISO): 53 U/L (ref 33–115)
Chloride: 103 mmol/L (ref 98–110)
Creat: 0.64 mg/dL (ref 0.50–1.10)
Globulin: 2.7 g/dL (calc) (ref 1.9–3.7)
Potassium: 4.1 mmol/L (ref 3.5–5.3)
Sodium: 140 mmol/L (ref 135–146)

## 2017-07-17 LAB — COMPREHENSIVE METABOLIC PANEL WITH GFR
ALT: 24 U/L (ref 6–29)
AST: 19 U/L (ref 10–30)
BUN: 12 mg/dL (ref 7–25)
CO2: 31 mmol/L (ref 20–32)
Calcium: 9.3 mg/dL (ref 8.6–10.2)
Glucose, Bld: 93 mg/dL (ref 65–99)
Total Bilirubin: 0.3 mg/dL (ref 0.2–1.2)
Total Protein: 6.7 g/dL (ref 6.1–8.1)

## 2017-07-17 LAB — HEMOGLOBIN A1C
Hgb A1c MFr Bld: 5.4 %{Hb} (ref <5.7)
Mean Plasma Glucose: 108 (calc)
eAG (mmol/L): 6 (calc)

## 2017-07-17 LAB — TSH: TSH: 0.4 m[IU]/L

## 2017-07-17 LAB — LIPID PANEL W/REFLEX DIRECT LDL
Cholesterol: 174 mg/dL (ref <200)
HDL: 38 mg/dL — ABNORMAL LOW (ref >50)
LDL Cholesterol (Calc): 106 mg/dL — ABNORMAL HIGH
Non-HDL Cholesterol (Calc): 136 mg/dL (calc) — ABNORMAL HIGH (ref <130)
Total CHOL/HDL Ratio: 4.6 (calc) (ref <5.0)
Triglycerides: 178 mg/dL — ABNORMAL HIGH (ref <150)

## 2017-07-17 LAB — VITAMIN D 25 HYDROXY (VIT D DEFICIENCY, FRACTURES): Vit D, 25-Hydroxy: 45 ng/mL (ref 30–100)

## 2017-07-17 LAB — HIV ANTIBODY (ROUTINE TESTING W REFLEX): HIV 1&2 Ab, 4th Generation: NONREACTIVE

## 2017-08-28 ENCOUNTER — Encounter: Payer: Self-pay | Admitting: Sports Medicine

## 2017-09-10 ENCOUNTER — Encounter: Payer: Self-pay | Admitting: Sports Medicine
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
## 2017-09-14 ENCOUNTER — Encounter: Payer: Self-pay | Admitting: Student

## 2017-09-14 ENCOUNTER — Ambulatory Visit (INDEPENDENT_AMBULATORY_CARE_PROVIDER_SITE_OTHER): Payer: BLUE CROSS/BLUE SHIELD | Admitting: Student

## 2017-09-14 VITALS — BP 118/72 | HR 83 | Ht 64.0 in | Wt 156.8 lb

## 2017-09-14 DIAGNOSIS — R002 Palpitations: Secondary | ICD-10-CM | POA: Diagnosis not present

## 2017-09-14 DIAGNOSIS — I491 Atrial premature depolarization: Secondary | ICD-10-CM

## 2017-09-14 DIAGNOSIS — I10 Essential (primary) hypertension: Secondary | ICD-10-CM

## 2017-09-14 DIAGNOSIS — Z01810 Encounter for preprocedural cardiovascular examination: Secondary | ICD-10-CM

## 2017-09-14 NOTE — Patient Instructions (Signed)
Randall AnBrittany Strader, PA-C recommends that you continue on your current medications as directed. Please refer to the Current Medication list given to you today.  Your physician has requested that you have an echocardiogram. Echocardiography is a painless test that uses sound waves to create images of your heart. It provides your doctor with information about the size and shape of your heart and how well your heart's chambers and valves are working. This procedure takes approximately one hour. There are no restrictions for this procedure. >>[facility] - [address], [address]  [place] recommends that you schedule a follow-up appointment in 12 months with Dr Jens Somrenshaw. You will receive a reminder letter in the mail two months in advance. If you don't receive a letter, please call our office to schedule the follow-up appointment.  If you need a refill on your cardiac medications before your next appointment, please call your pharmacy.

## 2017-09-15 ENCOUNTER — Encounter: Payer: Self-pay | Admitting: Student

## 2017-10-12 ENCOUNTER — Other Ambulatory Visit (HOSPITAL_COMMUNITY): Payer: BLUE CROSS/BLUE SHIELD

## 2017-10-22 ENCOUNTER — Other Ambulatory Visit: Payer: Self-pay | Admitting: Sports Medicine

## 2017-10-22 DIAGNOSIS — I10 Essential (primary) hypertension: Secondary | ICD-10-CM

## 2017-11-20 ENCOUNTER — Telehealth: Payer: Self-pay | Admitting: Sports Medicine

## 2017-11-20 MED ORDER — CANDESARTAN CILEXETIL 16 MG PO TABS: 16.0000 mg | ORAL_TABLET | Freq: Every day | ORAL | 3 refills | Status: DC

## 2017-11-20 NOTE — Telephone Encounter (Signed)
Irbesartan recalled, switching to candesartan in the meantime.

## 2018-01-22 ENCOUNTER — Other Ambulatory Visit: Payer: Self-pay | Admitting: Sports Medicine

## 2018-01-22 DIAGNOSIS — I10 Essential (primary) hypertension: Secondary | ICD-10-CM

## 2018-02-12 ENCOUNTER — Other Ambulatory Visit: Payer: Self-pay

## 2018-02-12 DIAGNOSIS — I10 Essential (primary) hypertension: Secondary | ICD-10-CM

## 2018-02-12 MED ORDER — HYDROCHLOROTHIAZIDE 12.5 MG PO TABS: 6.2500 mg | ORAL_TABLET | Freq: Two times a day (BID) | ORAL | 1 refills | Status: DC

## 2018-04-01 ENCOUNTER — Encounter: Payer: Self-pay | Admitting: Sports Medicine

## 2018-04-01 MED ORDER — DIAZEPAM 5 MG PO TABS: ORAL_TABLET | ORAL | 0 refills | Status: DC

## 2018-04-02 ENCOUNTER — Ambulatory Visit (INDEPENDENT_AMBULATORY_CARE_PROVIDER_SITE_OTHER): Payer: BLUE CROSS/BLUE SHIELD | Admitting: Sports Medicine

## 2018-04-02 ENCOUNTER — Encounter: Payer: Self-pay | Admitting: Sports Medicine

## 2018-04-02 DIAGNOSIS — I10 Essential (primary) hypertension: Secondary | ICD-10-CM | POA: Diagnosis not present

## 2018-04-02 DIAGNOSIS — G43009 Migraine without aura, not intractable, without status migrainosus: Secondary | ICD-10-CM

## 2018-04-02 MED ORDER — IRBESARTAN-HYDROCHLOROTHIAZIDE 150-12.5 MG PO TABS: 1.0000 | ORAL_TABLET | Freq: Every day | ORAL | 11 refills | Status: DC

## 2018-04-02 MED ORDER — TOPIRAMATE 50 MG PO TABS: ORAL_TABLET | ORAL | 11 refills | Status: DC

## 2018-04-02 NOTE — Progress Notes (Signed)
Subjective:    CC: Elevated blood pressure  HPI: This is a pleasant 42 year old female, she has been treated for her blood pressure for some time now, we had a few switch his medications due to recalls but overall her numbers have been well controlled.  More recently she is noted a couple of high blood pressure readings after drinking coffee, 130s to 140s over 90s to 100s.  She has had a few headaches but these feel more like her migraines, no visual changes, chest pain.  She is here for further evaluation and advice.  I reviewed the past medical history, family history, social history, surgical history, and allergies today and no changes were needed.  Please see the problem list section below in epic for further details.  Past Medical History: Past Medical History:  Diagnosis Date  . Esophagitis   . Hypertension    Past Surgical History: Past Surgical History:  Procedure Laterality Date  . gallbladder removed  2005  . ovarian cyst removed  2007   Social History: Social History   Socioeconomic History  . Marital status: Married    Spouse name: Not on file  . Number of children: 1  . Years of education: Not on file  . Highest education level: Not on file  Occupational History  . Not on file  Social Needs  . Financial resource strain: Not on file  . Food insecurity:    Worry: Not on file    Inability: Not on file  . Transportation needs:    Medical: Not on file    Non-medical: Not on file  Tobacco Use  . Smoking status: Never Smoker  . Smokeless tobacco: Never Used  Substance and Sexual Activity  . Alcohol use: No  . Drug use: No  . Sexual activity: Yes  Lifestyle  . Physical activity:    Days per week: Not on file    Minutes per session: Not on file  . Stress: Not on file  Relationships  . Social connections:    Talks on phone: Not on file    Gets together: Not on file    Attends religious service: Not on file    Active member of club or organization: Not on  file    Attends meetings of clubs or organizations: Not on file    Relationship status: Not on file  Other Topics Concern  . Not on file  Social History Narrative  . Not on file   Family History: Family History  Problem Relation Age of Onset  . Heart attack Unknown        grandmother  . Diabetes Unknown        grandmother  . Hypertension Mother        grandmother, aunt  . Stroke Unknown        grandmother   Allergies: Allergies  Allergen Reactions  . Cephalosporins Shortness Of Breath    Increased HR  . Quinolones Shortness Of Breath    Increased HR   Medications: See med rec.  Review of Systems: No fevers, chills, night sweats, weight loss, chest pain, or shortness of breath.   Objective:    General: Well Developed, well nourished, and in no acute distress.  Neuro: Alert and oriented x3, extra-ocular muscles intact, sensation grossly intact.  HEENT: Normocephalic, atraumatic, pupils equal round reactive to light, neck supple, no masses, no lymphadenopathy, thyroid nonpalpable.  Skin: Warm and dry, no rashes. Cardiac: Regular rate and rhythm, no murmurs rubs or gallops, no  lower extremity edema.  Respiratory: Clear to auscultation bilaterally. Not using accessory muscles, speaking in full sentences.  Impression and Recommendations:    Migraine headache Continue rizatriptan as needed, restarting topiramate.  Essential hypertension, benign Well-controlled, I am going to switch her to a combination pill.  I spent 25 minutes with this patient, greater than 50% was face-to-face time counseling regarding the above diagnoses ___________________________________________ Ihor Austin. Benjamin Stain, M.D., ABFM., CAQSM. Primary Care and Sports Medicine [facility]  Adjunct Instructor of Family Medicine  [employer]

## 2018-04-02 NOTE — Assessment & Plan Note (Signed)
Well-controlled, I am going to switch her to a combination pill.

## 2018-04-02 NOTE — Assessment & Plan Note (Signed)
Continue rizatriptan as needed, restarting topiramate.

## 2018-04-16 ENCOUNTER — Other Ambulatory Visit: Payer: Self-pay | Admitting: Sports Medicine

## 2018-04-16 ENCOUNTER — Other Ambulatory Visit: Payer: Self-pay | Admitting: Specialist

## 2018-04-16 DIAGNOSIS — Z1231 Encounter for screening mammogram for malignant neoplasm of breast: Secondary | ICD-10-CM

## 2018-05-01 ENCOUNTER — Ambulatory Visit: Payer: BLUE CROSS/BLUE SHIELD | Admitting: Sports Medicine

## 2018-05-06 ENCOUNTER — Ambulatory Visit
Admission: RE | Admit: 2018-05-06 | Discharge: 2018-05-06 | Disposition: A | Discharge status: Home / Self Care | Payer: BLUE CROSS/BLUE SHIELD | Source: Ambulatory Visit | Attending: Specialist | Admitting: Specialist

## 2018-05-06 DIAGNOSIS — Z1231 Encounter for screening mammogram for malignant neoplasm of breast: Secondary | ICD-10-CM

## 2018-06-25 ENCOUNTER — Other Ambulatory Visit: Payer: Self-pay | Admitting: *Deleted

## 2018-06-25 DIAGNOSIS — I10 Essential (primary) hypertension: Secondary | ICD-10-CM

## 2018-06-25 DIAGNOSIS — G43009 Migraine without aura, not intractable, without status migrainosus: Secondary | ICD-10-CM

## 2018-06-25 MED ORDER — IRBESARTAN-HYDROCHLOROTHIAZIDE 150-12.5 MG PO TABS: 1.0000 | ORAL_TABLET | Freq: Every day | ORAL | 3 refills | Status: DC

## 2018-06-25 MED ORDER — TOPIRAMATE 50 MG PO TABS: 50.0000 mg | ORAL_TABLET | Freq: Two times a day (BID) | ORAL | 3 refills | Status: DC

## 2018-07-11 ENCOUNTER — Encounter: Payer: Self-pay | Admitting: Sports Medicine

## 2018-07-11 MED ORDER — DIAZEPAM 5 MG PO TABS: ORAL_TABLET | ORAL | 0 refills | Status: DC

## 2018-07-19 ENCOUNTER — Ambulatory Visit (INDEPENDENT_AMBULATORY_CARE_PROVIDER_SITE_OTHER): Payer: BLUE CROSS/BLUE SHIELD | Admitting: Sports Medicine

## 2018-07-19 DIAGNOSIS — Z0000 Encounter for general adult medical examination without abnormal findings: Secondary | ICD-10-CM | POA: Diagnosis not present

## 2018-07-19 DIAGNOSIS — G43009 Migraine without aura, not intractable, without status migrainosus: Secondary | ICD-10-CM | POA: Diagnosis not present

## 2018-07-19 DIAGNOSIS — E042 Nontoxic multinodular goiter: Secondary | ICD-10-CM

## 2018-07-19 DIAGNOSIS — I10 Essential (primary) hypertension: Secondary | ICD-10-CM | POA: Diagnosis not present

## 2018-07-19 MED ORDER — RIZATRIPTAN BENZOATE 5 MG PO TBDP: 10.0000 mg | ORAL_TABLET | ORAL | 3 refills | Status: AC | PRN

## 2018-07-19 NOTE — Assessment & Plan Note (Signed)
Annual physical as above, checking routine labs.   Flu shot today.

## 2018-07-19 NOTE — Assessment & Plan Note (Signed)
Unchanged white matter disease on several MRIs. Having mostly a cephalgia migraines. Well-controlled with Topamax, uses Maxalt occasionally when she feels an aura. These are usually visual and last only 15 or 20 minutes. She is having significant frontal/facial morning headaches, she will use Flonase every evening for the next couple weeks. I would also like a second opinion from neurology as this certainly could represent benign intracranial hypertension, she may need a spinal tap.

## 2018-07-19 NOTE — Assessment & Plan Note (Addendum)
Overdue for repeat thyroid ultrasound.  Nodule 2 needs follow-up in a year, nodule four needs an ultrasound biopsy, I am going to order the biopsy.

## 2018-07-19 NOTE — Assessment & Plan Note (Signed)
Well-controlled, no changes needed 

## 2018-07-19 NOTE — Progress Notes (Addendum)
Subjective:    CC: Annual physical exam  HPI:  This is a pleasant 42 year old female here for her physical, her only complaint is she is now having an aura.  She typically has an aura mostly visual, but lately because of Topamax has not really had headaches afterwards, mostly a cephalgia migraines.  She has however noted increasing headaches, frontal in the mornings, nearly every morning without nasal discharge.  These are not seasonal.  She did have some white matter changes on an MRI that were unchanged on a second MRI of the brain.  Hypertension: Well-controlled.  Thyroid nodules: Due to recheck.  I reviewed the past medical history, family history, social history, surgical history, and allergies today and no changes were needed.  Please see the problem list section below in epic for further details.  Past Medical History: Past Medical History:  Diagnosis Date  . Esophagitis   . Hypertension    Past Surgical History: Past Surgical History:  Procedure Laterality Date  . gallbladder removed  2005  . ovarian cyst removed  2007   Social History: Social History   Socioeconomic History  . Marital status: Married    Spouse name: Not on file  . Number of children: 1  . Years of education: Not on file  . Highest education level: Not on file  Occupational History  . Not on file  Social Needs  . Financial resource strain: Not on file  . Food insecurity:    Worry: Not on file    Inability: Not on file  . Transportation needs:    Medical: Not on file    Non-medical: Not on file  Tobacco Use  . Smoking status: Never Smoker  . Smokeless tobacco: Never Used  Substance and Sexual Activity  . Alcohol use: No  . Drug use: No  . Sexual activity: Yes  Lifestyle  . Physical activity:    Days per week: Not on file    Minutes per session: Not on file  . Stress: Not on file  Relationships  . Social connections:    Talks on phone: Not on file    Gets together: Not on file   Attends religious service: Not on file    Active member of club or organization: Not on file    Attends meetings of clubs or organizations: Not on file    Relationship status: Not on file  Other Topics Concern  . Not on file  Social History Narrative  . Not on file   Family History: Family History  Problem Relation Age of Onset  . Heart attack Unknown        grandmother  . Diabetes Unknown        grandmother  . Hypertension Mother        grandmother, aunt  . Stroke Unknown        grandmother   Allergies: Allergies  Allergen Reactions  . Cephalosporins Shortness Of Breath    Increased HR  . Quinolones Shortness Of Breath    Increased HR   Medications: See med rec.  Review of Systems: No headache, visual changes, nausea, vomiting, diarrhea, constipation, dizziness, abdominal pain, skin rash, fevers, chills, night sweats, swollen lymph nodes, weight loss, chest pain, body aches, joint swelling, muscle aches, shortness of breath, mood changes, visual or auditory hallucinations.  Objective:    General: Well Developed, well nourished, and in no acute distress.  Neuro: Alert and oriented x3, extra-ocular muscles intact, sensation grossly intact. Cranial nerves II through  XII are intact, motor, sensory, and coordinative functions are all intact. HEENT: Normocephalic, atraumatic, pupils equal round reactive to light, neck supple, no masses, no lymphadenopathy, thyroid nonpalpable. Oropharynx, nasopharynx, external ear canals are unremarkable. Skin: Warm and dry, no rashes noted.  Cardiac: Regular rate and rhythm, no murmurs rubs or gallops.  Respiratory: Clear to auscultation bilaterally. Not using accessory muscles, speaking in full sentences.  Abdominal: Soft, nontender, nondistended, positive bowel sounds, no masses, no organomegaly.  Musculoskeletal: Shoulder, elbow, wrist, hip, knee, ankle stable, and with full range of motion.  Impression and Recommendations:    The patient  was counselled, risk factors were discussed, anticipatory guidance given.  Annual physical exam Annual physical as above, checking routine labs.   Flu shot today.  Multiple thyroid nodules Overdue for repeat thyroid ultrasound.  Nodule 2 needs follow-up in a year, nodule four needs an ultrasound biopsy, I am going to order the biopsy.  Migraine headache Unchanged white matter disease on several MRIs. Having mostly a cephalgia migraines. Well-controlled with Topamax, uses Maxalt occasionally when she feels an aura. These are usually visual and last only 15 or 20 minutes. She is having significant frontal/facial morning headaches, she will use Flonase every evening for the next couple weeks. I would also like a second opinion from neurology as this certainly could represent benign intracranial hypertension, she may need a spinal tap.  Essential hypertension, benign Well-controlled, no changes needed.  ___________________________________________ Ihor Austin. Benjamin Stain, M.D., ABFM., CAQSM. Primary Care and Sports Medicine [facility]  Adjunct Instructor of Family Medicine  [employer]

## 2018-08-01 ENCOUNTER — Ambulatory Visit (INDEPENDENT_AMBULATORY_CARE_PROVIDER_SITE_OTHER): Payer: BLUE CROSS/BLUE SHIELD

## 2018-08-01 DIAGNOSIS — E042 Nontoxic multinodular goiter: Secondary | ICD-10-CM | POA: Diagnosis not present

## 2018-08-01 NOTE — Addendum Note (Signed)
Addended by: Monica Becton on: 08/01/2018 01:20 PM   Modules accepted: Orders

## 2018-08-02 LAB — COMPREHENSIVE METABOLIC PANEL WITH GFR
Albumin: 4.4 g/dL (ref 3.6–5.1)
CO2: 28 mmol/L (ref 20–32)
Chloride: 105 mmol/L (ref 98–110)
Creat: 0.66 mg/dL (ref 0.50–1.10)
Globulin: 2.7 g/dL (ref 1.9–3.7)
Glucose, Bld: 103 mg/dL — ABNORMAL HIGH (ref 65–99)
Potassium: 4 mmol/L (ref 3.5–5.3)
Sodium: 141 mmol/L (ref 135–146)

## 2018-08-02 LAB — CBC
HCT: 40.9 % (ref 35.0–45.0)
Hemoglobin: 13.7 g/dL (ref 11.7–15.5)
MCH: 29.5 pg (ref 27.0–33.0)
MCHC: 33.5 g/dL (ref 32.0–36.0)
MCV: 88 fL (ref 80.0–100.0)
MPV: 10.9 fL (ref 7.5–12.5)
Platelets: 239 Thousand/uL (ref 140–400)
RBC: 4.65 Million/uL (ref 3.80–5.10)
RDW: 13 % (ref 11.0–15.0)
WBC: 7.9 10*3/uL (ref 3.8–10.8)

## 2018-08-02 LAB — LIPID PANEL W/REFLEX DIRECT LDL
Cholesterol: 179 mg/dL (ref <200)
HDL: 43 mg/dL — ABNORMAL LOW (ref >50)
LDL Cholesterol (Calc): 103 mg/dL (calc) — ABNORMAL HIGH
Non-HDL Cholesterol (Calc): 136 mg/dL — ABNORMAL HIGH (ref <130)
Total CHOL/HDL Ratio: 4.2 (calc) (ref <5.0)
Triglycerides: 210 mg/dL — ABNORMAL HIGH (ref <150)

## 2018-08-02 LAB — COMPREHENSIVE METABOLIC PANEL
AG Ratio: 1.6 (calc) (ref 1.0–2.5)
ALT: 27 U/L (ref 6–29)
AST: 22 U/L (ref 10–30)
Alkaline phosphatase (APISO): 64 U/L (ref 33–115)
BUN: 15 mg/dL (ref 7–25)
Calcium: 9.5 mg/dL (ref 8.6–10.2)
Total Bilirubin: 0.6 mg/dL (ref 0.2–1.2)
Total Protein: 7.1 g/dL (ref 6.1–8.1)

## 2018-08-02 LAB — HEMOGLOBIN A1C
Hgb A1c MFr Bld: 5.6 %{Hb} (ref <5.7)
Mean Plasma Glucose: 114 (calc)
eAG (mmol/L): 6.3 (calc)

## 2018-08-02 LAB — TSH: TSH: 1.03 mIU/L

## 2018-08-20 ENCOUNTER — Ambulatory Visit
Admission: RE | Admit: 2018-08-20 | Discharge: 2018-08-20 | Disposition: A | Discharge status: Home / Self Care | Payer: BLUE CROSS/BLUE SHIELD | Source: Ambulatory Visit | Attending: Sports Medicine | Admitting: Sports Medicine

## 2018-08-20 ENCOUNTER — Other Ambulatory Visit (HOSPITAL_COMMUNITY)
Admission: RE | Admit: 2018-08-20 | Discharge: 2018-08-20 | Disposition: A | Discharge status: Home / Self Care | Payer: BLUE CROSS/BLUE SHIELD | Source: Ambulatory Visit | Attending: Radiology | Admitting: Radiology

## 2018-08-20 DIAGNOSIS — E041 Nontoxic single thyroid nodule: Secondary | ICD-10-CM | POA: Insufficient documentation

## 2018-08-22 NOTE — Progress Notes (Signed)
Pt has seen results on MyChart and message also sent for patient to call back if any questions.

## 2018-09-25 ENCOUNTER — Encounter: Payer: Self-pay | Admitting: Sports Medicine

## 2018-09-25 MED ORDER — DIAZEPAM 5 MG PO TABS: ORAL_TABLET | ORAL | 0 refills | Status: DC

## 2018-09-30 ENCOUNTER — Ambulatory Visit (INDEPENDENT_AMBULATORY_CARE_PROVIDER_SITE_OTHER): Payer: BLUE CROSS/BLUE SHIELD | Admitting: Neurology

## 2018-09-30 ENCOUNTER — Encounter: Payer: Self-pay | Admitting: Neurology

## 2018-09-30 VITALS — BP 128/86 | HR 72 | Ht 64.0 in | Wt 156.0 lb

## 2018-09-30 DIAGNOSIS — R413 Other amnesia: Secondary | ICD-10-CM | POA: Diagnosis not present

## 2018-09-30 DIAGNOSIS — G43709 Chronic migraine without aura, not intractable, without status migrainosus: Secondary | ICD-10-CM

## 2018-09-30 DIAGNOSIS — E538 Deficiency of other specified B group vitamins: Secondary | ICD-10-CM

## 2018-09-30 DIAGNOSIS — R4701 Aphasia: Secondary | ICD-10-CM

## 2018-09-30 DIAGNOSIS — G4489 Other headache syndrome: Secondary | ICD-10-CM | POA: Diagnosis not present

## 2018-09-30 DIAGNOSIS — R9082 White matter disease, unspecified: Secondary | ICD-10-CM | POA: Diagnosis not present

## 2018-09-30 DIAGNOSIS — G379 Demyelinating disease of central nervous system, unspecified: Secondary | ICD-10-CM

## 2018-09-30 DIAGNOSIS — E519 Thiamine deficiency, unspecified: Secondary | ICD-10-CM

## 2018-09-30 NOTE — Progress Notes (Signed)
[facility]    Provider:  Dr Lucia Gaskins Referring Provider: Monica Becton Primary Care Physician:  Monica Becton, MD  CC:  Migraines  HPI:  Debbie Johnston is a 42 y.o. female here as requested by Dr. Benjamin Stain for migraines. PMHx migraines. Started 15-17 years ago. She moved to the [place] in 2010. Usually without aura, 5-6 times a year she sees a big spot like a jely fish as an aura for 20 minutes followed by the migraine. She had uncontrolled HTN for years. MRIs stable from 2015 to 106. Migraines havent changed, no new symptoms or changes in frequency or severity. She also has some tension type headaches. She can wake up with the headaches sometimes but may not happen for a month or sp. She feels the headaches are positional and her vision can get blurry laying down. Topamax has helped with the daily headaches and the migraines. She is having some memory loss. She feels she has aphasia. She was on topiramate several years ago and she stopped the medication. She was off of the Topiramate for a year. Went back on about 3 months ago. Feels memory is worsening. Takes Topiramate in themorning.  Reviewed notes, labs and imaging from outside physicians, which showed:  MRI of the brain 2016 reviewed images: Stable from 2015. However agree with the following:  IMPRESSION: 1. No acute intracranial abnormality or mass. 2. Unchanged cerebral white matter disease, markedly abnormal for age and nonspecific. Considerations include chronic small vessel ischemia, sequelae of trauma, hypercoagulable state, vasculitis, migraines, prior infection and demyelination.  Review of Systems: Patient complains of symptoms per HPI as well as the following symptoms: migraine. Pertinent negatives and positives per HPI. All others negative.   Social History   Socioeconomic History  . Marital status: Married    Spouse name: Not on file  . Number of children: 1  .  Years of education: Not on file  . Highest education level: Master's degree (e.g., MA, MS, MEng, MEd, MSW, MBA)  Occupational History  . Not on file  Social Needs  . Financial resource strain: Not on file  . Food insecurity:    Worry: Not on file    Inability: Not on file  . Transportation needs:    Medical: Not on file    Non-medical: Not on file  Tobacco Use  . Smoking status: Never Smoker  . Smokeless tobacco: Never Used  Substance and Sexual Activity  . Alcohol use: No  . Drug use: No  . Sexual activity: Yes    Birth control/protection: None  Lifestyle  . Physical activity:    Days per week: Not on file    Minutes per session: Not on file  . Stress: Not on file  Relationships  . Social connections:    Talks on phone: Not on file    Gets together: Not on file    Attends religious service: Not on file    Active member of club or organization: Not on file    Attends meetings of clubs or organizations: Not on file    Relationship status: Not on file  . Intimate partner violence:    Fear of current or ex partner: Not on file    Emotionally abused: Not on file    Physically abused: Not on file    Forced sexual activity: Not on file  Other Topics Concern  . Not on file  Social History Narrative   Lives at home with husband and child  Right handed   Caffeine: about 3 cups daily    Family History  Problem Relation Age of Onset  . Hypertension Mother   . Migraines Mother        ? when young, not diagnosed   . Hypertension Maternal Grandmother   . Diabetes Maternal Grandmother   . Stroke Maternal Grandmother   . Heart attack Maternal Grandmother   . Other Maternal Grandmother        enlarged heart   . Cancer Paternal Grandmother   . Hypertension Maternal Aunt   . Cancer Paternal Aunt     Past Medical History:  Diagnosis Date  . Esophagitis   . Hypertension   . Migraines     Past Surgical History:  Procedure Laterality Date  . gallbladder removed  2005    . ovarian cyst removed  2007    Current Outpatient Medications  Medication Sig Dispense Refill  . diazepam (VALIUM) 5 MG tablet One tab by mouth, 1 hours before event. 30 tablet 0  . irbesartan-hydrochlorothiazide (AVALIDE) 150-12.5 MG tablet Take 1 tablet by mouth daily. 90 tablet 3  . Multiple Vitamins-Minerals (MULTIVITAMIN PO) Take by mouth daily.    . Omega-3 Fatty Acids (FISH OIL PO) Take by mouth.    . psyllium (METAMUCIL SMOOTH TEXTURE) 58.6 % powder Take 1 packet by mouth 3 (three) times daily. 283 g 12  . rizatriptan (MAXALT-MLT) 5 MG disintegrating tablet Take 2 tablets (10 mg total) by mouth as needed for migraine. May repeat in 2 hours if needed 10 tablet 3  . topiramate (TOPAMAX) 50 MG tablet Take 1 tablet (50 mg total) by mouth 2 (two) times daily. 180 tablet 3  . VITAMIN E PO Take by mouth.    . diclofenac (VOLTAREN) 75 MG EC tablet Take 1 tablet (75 mg total) by mouth 2 (two) times daily. (Patient not taking: Reported on 09/30/2018) 180 tablet 0   No current facility-administered medications for this visit.     Allergies as of 09/30/2018 - Review Complete 09/30/2018  Allergen Reaction Noted  . Cephalosporins Shortness Of Breath 08/14/2013  . Quinolones Shortness Of Breath 05/12/2015    Vitals: BP 128/86 (BP Location: Left Arm, Patient Position: Sitting)   Pulse 72   Ht 5\' 4"  (1.626 m)   Wt 156 lb (70.8 kg)   LMP 09/23/2018   BMI 26.78 kg/m  Last Weight:  Wt Readings from Last 1 Encounters:  09/30/18 156 lb (70.8 kg)   Last Height:   Ht Readings from Last 1 Encounters:  09/30/18 5\' 4"  (1.626 m)    Physical exam: Exam: Gen: NAD, conversant, well nourised, well groomed                     CV: RRR, no MRG. No Carotid Bruits. No peripheral edema, warm, nontender Eyes: Conjunctivae clear without exudates or hemorrhage  Neuro: Detailed Neurologic Exam  Speech:    Speech is normal; fluent and spontaneous with normal comprehension.  Cognition:    The  patient is oriented to person, place, and time;     recent and remote memory intact;     language fluent;     normal attention, concentration,     fund of knowledge Cranial Nerves:    The pupils are equal, round, and reactive to light. The fundi are normal and spontaneous venous pulsations are present. Visual fields are full to finger confrontation. Extraocular movements are intact. Trigeminal sensation is intact and the muscles of mastication  are normal. The face is symmetric. The palate elevates in the midline. Hearing intact. Voice is normal. Shoulder shrug is normal. The tongue has normal motion without fasciculations.   Coordination:    Normal finger to nose and heel to shin. Normal rapid alternating movements.   Gait:    Heel-toe and tandem gait are normal.   Motor Observation:    No asymmetry, no atrophy, and no involuntary movements noted. Tone:    Normal muscle tone.    Posture:    Posture is normal. normal erect    Strength:    Strength is V/V in the upper and lower limbs.      Sensation: intact to LT     Reflex Exam:  DTR's:    Deep tendon reflexes in the upper and lower extremities are normal bilaterally.   Toes:    The toes are downgoing bilaterally.   Clonus:    Clonus is absent.      Assessment/Plan:  42year old with migraines and reported memory change (likely medication related started Topiramate). However given her extensive white matter disease (for age) and c/o memory loss, aphasia need repeat imaging of the brain with MS protocol.  Repeat MRI brain w/wo contrast (prefers [place] [facility]) Can consider changing medications Topiramate  If MRI negative come back to clinic or email me and we can change medications for migraines  Orders Placed This Encounter  Procedures  . MR BRAIN W WO CONTRAST  . B12 and Folate Panel  . Methylmalonic acid, serum  . Basic Metabolic Panel  . Vitamin B1    Discussed: To prevent or relieve headaches, try the  following: Cool Compress. Lie down and place a cool compress on your head.  Avoid headache triggers. If certain foods or odors seem to have triggered your migraines in the past, avoid them. A headache diary might help you identify triggers.  Include physical activity in your daily routine. Try a daily walk or other moderate aerobic exercise.  Manage stress. Find healthy ways to cope with the stressors, such as delegating tasks on your to-do list.  Practice relaxation techniques. Try deep breathing, yoga, massage and visualization.  Eat regularly. Eating regularly scheduled meals and maintaining a healthy diet might help prevent headaches. Also, drink plenty of fluids.  Follow a regular sleep schedule. Sleep deprivation might contribute to headaches Consider biofeedback. With this mind-body technique, you learn to control certain bodily functions - such as muscle tension, heart rate and blood pressure - to prevent headaches or reduce headache pain.    Proceed to emergency room if you experience new or worsening symptoms or symptoms do not resolve, if you have new neurologic symptoms or if headache is severe, or for any concerning symptom.   Provided education and documentation from American headache Society toolbox including articles on: chronic migraine medication overuse headache, chronic migraines, prevention of migraines, behavioral and other nonpharmacologic treatments for headache.     Naomie Dean, MD  [facility] [address] [place], [place] [address]  Phone [phone] Fax [phone]

## 2018-09-30 NOTE — Patient Instructions (Addendum)
Topiramate tablets What is this medicine? TOPIRAMATE (toe PYRE a mate) is used to treat seizures in adults or children with epilepsy. It is also used for the prevention of migraine headaches. This medicine may be used for other purposes; ask your health care provider or pharmacist if you have questions. COMMON BRAND NAME(S): Topamax, Topiragen What should I tell my health care provider before I take this medicine? They need to know if you have any of these conditions: -bleeding disorders -cirrhosis of the liver or liver disease -diarrhea -glaucoma -kidney stones or kidney disease -low blood counts, like low white cell, platelet, or red cell counts -lung disease like asthma, obstructive pulmonary disease, emphysema -metabolic acidosis -on a ketogenic diet -schedule for surgery or a procedure -suicidal thoughts, plans, or attempt; a previous suicide attempt by you or a family member -an unusual or allergic reaction to topiramate, other medicines, foods, dyes, or preservatives -pregnant or trying to get pregnant -breast-feeding How should I use this medicine? Take this medicine by mouth with a glass of water. Follow the directions on the prescription label. Do not crush or chew. You may take this medicine with meals. Take your medicine at regular intervals. Do not take it more often than directed. Talk to your pediatrician regarding the use of this medicine in children. Special care may be needed. While this drug may be prescribed for children as young as 2 years of age for selected conditions, precautions do apply. Overdosage: If you think you have taken too much of this medicine contact a poison control center or emergency room at once. NOTE: This medicine is only for you. Do not share this medicine with others. What if I miss a dose? If you miss a dose, take it as soon as you can. If your next dose is to be taken in less than 6 hours, then do not take the missed dose. Take the next dose at  your regular time. Do not take double or extra doses. What may interact with this medicine? Do not take this medicine with any of the following medications: -probenecid This medicine may also interact with the following medications: -acetazolamide -alcohol -amitriptyline -aspirin and aspirin-like medicines -birth control pills -certain medicines for depression -certain medicines for seizures -certain medicines that treat or prevent blood clots like warfarin, enoxaparin, dalteparin, apixaban, dabigatran, and rivaroxaban -digoxin -hydrochlorothiazide -lithium -medicines for pain, sleep, or muscle relaxation -metformin -methazolamide -NSAIDS, medicines for pain and inflammation, like ibuprofen or naproxen -pioglitazone -risperidone This list may not describe all possible interactions. Give your health care provider a list of all the medicines, herbs, non-prescription drugs, or dietary supplements you use. Also tell them if you smoke, drink alcohol, or use illegal drugs. Some items may interact with your medicine. What should I watch for while using this medicine? Visit your doctor or health care professional for regular checks on your progress. Do not stop taking this medicine suddenly. This increases the risk of seizures if you are using this medicine to control epilepsy. Wear a medical identification bracelet or chain to say you have epilepsy or seizures, and carry a card that lists all your medicines. This medicine can decrease sweating and increase your body temperature. Watch for signs of deceased sweating or fever, especially in children. Avoid extreme heat, hot baths, and saunas. Be careful about exercising, especially in hot weather. Contact your health care provider right away if you notice a fever or decrease in sweating. You should drink plenty of fluids while taking this medicine.   If you have had kidney stones in the past, this will help to reduce your chances of forming kidney  stones. If you have stomach pain, with nausea or vomiting and yellowing of your eyes or skin, call your doctor immediately. You may get drowsy, dizzy, or have blurred vision. Do not drive, use machinery, or do anything that needs mental alertness until you know how this medicine affects you. To reduce dizziness, do not sit or stand up quickly, especially if you are an older patient. Alcohol can increase drowsiness and dizziness. Avoid alcoholic drinks. If you notice blurred vision, eye pain, or other eye problems, seek medical attention at once for an eye exam. The use of this medicine may increase the chance of suicidal thoughts or actions. Pay special attention to how you are responding while on this medicine. Any worsening of mood, or thoughts of suicide or dying should be reported to your health care professional right away. This medicine may increase the chance of developing metabolic acidosis. If left untreated, this can cause kidney stones, bone disease, or slowed growth in children. Symptoms include breathing fast, fatigue, loss of appetite, irregular heartbeat, or loss of consciousness. Call your doctor immediately if you experience any of these side effects. Also, tell your doctor about any surgery you plan on having while taking this medicine since this may increase your risk for metabolic acidosis. Birth control pills may not work properly while you are taking this medicine. Talk to your doctor about using an extra method of birth control. Women who become pregnant while using this medicine may enroll in the [place] American Antiepileptic Drug Pregnancy Registry by calling 1-888-233-2334. This registry collects information about the safety of antiepileptic drug use during pregnancy. What side effects may I notice from receiving this medicine? Side effects that you should report to your doctor or health care professional as soon as possible: -allergic reactions like skin rash, itching or hives,  swelling of the face, lips, or tongue -decreased sweating and/or rise in body temperature -depression -difficulty breathing, fast or irregular breathing patterns -difficulty speaking -difficulty walking or controlling muscle movements -hearing impairment -redness, blistering, peeling or loosening of the skin, including inside the mouth -tingling, pain or numbness in the hands or feet -unusual bleeding or bruising -unusually weak or tired -worsening of mood, thoughts or actions of suicide or dying Side effects that usually do not require medical attention (report to your doctor or health care professional if they continue or are bothersome): -altered taste -back pain, joint or muscle aches and pains -diarrhea, or constipation -headache -loss of appetite -nausea -stomach upset, indigestion -tremors This list may not describe all possible side effects. Call your doctor for medical advice about side effects. You may report side effects to FDA at 1-800-FDA-1088. Where should I keep my medicine? Keep out of the reach of children. Store at room temperature between 15 and 30 degrees C (59 and 86 degrees F) in a tightly closed container. Protect from moisture. Throw away any unused medicine after the expiration date. NOTE: This sheet is a summary. It may not cover all possible information. If you have questions about this medicine, talk to your doctor, pharmacist, or health care provider.  2018 Elsevier/Gold Standard (2013-10-06 23:17:57)  

## 2018-10-01 ENCOUNTER — Encounter: Payer: Self-pay | Admitting: Neurology

## 2018-10-01 DIAGNOSIS — R9082 White matter disease, unspecified: Secondary | ICD-10-CM | POA: Insufficient documentation

## 2018-10-02 ENCOUNTER — Telehealth: Payer: Self-pay | Admitting: Neurology

## 2018-10-02 NOTE — Telephone Encounter (Signed)
Patient is aware and I gave her GI phone number she stated she is going to give them a call to schedule.

## 2018-10-02 NOTE — Telephone Encounter (Signed)
[employer] Auth: [employer] via [employer] website order sent to GI. They will reach out to the pt to schedule.

## 2018-10-07 ENCOUNTER — Encounter: Payer: Self-pay | Admitting: Neurology

## 2018-10-13 ENCOUNTER — Ambulatory Visit
Admission: RE | Admit: 2018-10-13 | Discharge: 2018-10-13 | Disposition: A | Discharge status: Home / Self Care | Payer: BLUE CROSS/BLUE SHIELD | Source: Ambulatory Visit | Attending: Neurology | Admitting: Neurology

## 2018-10-13 DIAGNOSIS — G4489 Other headache syndrome: Secondary | ICD-10-CM

## 2018-10-13 DIAGNOSIS — R413 Other amnesia: Secondary | ICD-10-CM

## 2018-10-13 DIAGNOSIS — G379 Demyelinating disease of central nervous system, unspecified: Secondary | ICD-10-CM

## 2018-10-13 DIAGNOSIS — R9082 White matter disease, unspecified: Secondary | ICD-10-CM

## 2018-10-13 DIAGNOSIS — R4701 Aphasia: Secondary | ICD-10-CM

## 2018-10-13 MED ORDER — GADOBENATE DIMEGLUMINE 529 MG/ML IV SOLN
14.0000 mL | Freq: Once | INTRAVENOUS | Status: AC | PRN
  Administered 2018-10-13: 14 mL via INTRAVENOUS

## 2018-10-23 ENCOUNTER — Telehealth: Payer: Self-pay | Admitting: Neurology

## 2018-10-23 NOTE — Telephone Encounter (Signed)
Let patient know labs normal (b1,b12,mma, bmp (creatinine .63 and BUN 14). Placed for scanning thanks

## 2018-10-24 NOTE — Telephone Encounter (Signed)
Messaged patient through mychart.

## 2018-12-11 ENCOUNTER — Encounter: Payer: Self-pay | Admitting: Sports Medicine

## 2018-12-12 MED ORDER — DIAZEPAM 5 MG PO TABS: ORAL_TABLET | ORAL | 0 refills | Status: DC

## 2019-03-26 ENCOUNTER — Ambulatory Visit: Payer: BC Managed Care – PPO | Admitting: Sports Medicine

## 2019-04-04 ENCOUNTER — Encounter: Payer: Self-pay | Admitting: Sports Medicine

## 2019-04-04 MED ORDER — DIAZEPAM 5 MG PO TABS: ORAL_TABLET | ORAL | 2 refills | Status: DC

## 2019-07-22 ENCOUNTER — Ambulatory Visit (INDEPENDENT_AMBULATORY_CARE_PROVIDER_SITE_OTHER): Payer: BC Managed Care – PPO | Admitting: Sports Medicine

## 2019-07-22 ENCOUNTER — Encounter: Payer: Self-pay | Admitting: Sports Medicine

## 2019-07-22 ENCOUNTER — Other Ambulatory Visit: Payer: Self-pay

## 2019-07-22 VITALS — BP 114/73 | HR 79 | Temp 98.0°F | Ht 64.0 in | Wt 149.0 lb

## 2019-07-22 DIAGNOSIS — E042 Nontoxic multinodular goiter: Secondary | ICD-10-CM

## 2019-07-22 DIAGNOSIS — I10 Essential (primary) hypertension: Secondary | ICD-10-CM

## 2019-07-22 DIAGNOSIS — Z0000 Encounter for general adult medical examination without abnormal findings: Secondary | ICD-10-CM | POA: Diagnosis not present

## 2019-07-22 DIAGNOSIS — F4320 Adjustment disorder, unspecified: Secondary | ICD-10-CM | POA: Insufficient documentation

## 2019-07-22 DIAGNOSIS — K580 Irritable bowel syndrome with diarrhea: Secondary | ICD-10-CM

## 2019-07-22 DIAGNOSIS — F4329 Adjustment disorder with other symptoms: Secondary | ICD-10-CM

## 2019-07-22 DIAGNOSIS — Z23 Encounter for immunization: Secondary | ICD-10-CM | POA: Diagnosis not present

## 2019-07-22 MED ORDER — ESCITALOPRAM OXALATE 5 MG PO TABS: 5.0000 mg | ORAL_TABLET | Freq: Every day | ORAL | 3 refills | Status: DC

## 2019-07-22 NOTE — Addendum Note (Signed)
Addended by: Alena Bills R on: 07/22/2019 10:21 AM   Modules accepted: Orders

## 2019-07-22 NOTE — Assessment & Plan Note (Signed)
IBS with probably improved with Lexapro treatment. Continue fiber supplements.

## 2019-07-22 NOTE — Assessment & Plan Note (Signed)
Routine physical as above. Flu shot today. Pap smear was done last month.

## 2019-07-22 NOTE — Assessment & Plan Note (Signed)
Well-controlled, no changes needed 

## 2019-07-22 NOTE — Assessment & Plan Note (Signed)
Currently in the divorce process. Adding Lexapro 5, return in 1 month for repeat PHQ and GAD. She can continue to use Valium as needed.

## 2019-07-22 NOTE — Progress Notes (Addendum)
Subjective:    CC: Annual physical  HPI:  This is a pleasant 43 year old female here for her physical.  Preventive measures: Flu shot today, she had a Pap a month ago with ASCUS.  Mood disorder: Currently going through divorce, increased anxiety, depressive symptoms, worsening IBS.  Agreeable to use an SSRI, she is currently doing behavioral therapy.  Hypertension: Well-controlled.  Hyperlipidemia: Due to recheck lipids.  Thyroid nodules: Due for repeat ultrasound for nodule #2.  I reviewed the past medical history, family history, social history, surgical history, and allergies today and no changes were needed.  Please see the problem list section below in epic for further details.  Past Medical History: Past Medical History:  Diagnosis Date  . Esophagitis   . Hypertension   . Migraines    Past Surgical History: Past Surgical History:  Procedure Laterality Date  . gallbladder removed  2005  . ovarian cyst removed  2007   Social History: Social History   Socioeconomic History  . Marital status: Married    Spouse name: Not on file  . Number of children: 1  . Years of education: Not on file  . Highest education level: Master's degree (e.g., MA, MS, MEng, MEd, MSW, MBA)  Occupational History  . Not on file  Social Needs  . Financial resource strain: Not on file  . Food insecurity    Worry: Not on file    Inability: Not on file  . Transportation needs    Medical: Not on file    Non-medical: Not on file  Tobacco Use  . Smoking status: Never Smoker  . Smokeless tobacco: Never Used  Substance and Sexual Activity  . Alcohol use: No  . Drug use: No  . Sexual activity: Yes    Birth control/protection: None  Lifestyle  . Physical activity    Days per week: Not on file    Minutes per session: Not on file  . Stress: Not on file  Relationships  . Social Herbalist on phone: Not on file    Gets together: Not on file    Attends religious service: Not on  file    Active member of club or organization: Not on file    Attends meetings of clubs or organizations: Not on file    Relationship status: Not on file  Other Topics Concern  . Not on file  Social History Narrative   Lives at home with husband and child   Right handed   Caffeine: about 3 cups daily   Family History: Family History  Problem Relation Age of Onset  . Hypertension Mother   . Migraines Mother        ? when young, not diagnosed   . Hypertension Maternal Grandmother   . Diabetes Maternal Grandmother   . Stroke Maternal Grandmother   . Heart attack Maternal Grandmother   . Other Maternal Grandmother        enlarged heart   . Cancer Paternal Grandmother   . Hypertension Maternal Aunt   . Cancer Paternal Aunt    Allergies: Allergies  Allergen Reactions  . Cephalosporins Shortness Of Breath    Increased HR  . Quinolones Shortness Of Breath    Increased HR   Medications: See med rec.  Review of Systems: No headache, visual changes, nausea, vomiting, diarrhea, constipation, dizziness, abdominal pain, skin rash, fevers, chills, night sweats, swollen lymph nodes, weight loss, chest pain, body aches, joint swelling, muscle aches, shortness of breath, mood  changes, visual or auditory hallucinations.  Objective:    General: Well Developed, well nourished, and in no acute distress.  Neuro: Alert and oriented x3, extra-ocular muscles intact, sensation grossly intact. Cranial nerves II through XII are intact, motor, sensory, and coordinative functions are all intact. HEENT: Normocephalic, atraumatic, pupils equal round reactive to light, neck supple, no masses, no lymphadenopathy, thyroid nonpalpable. Oropharynx, nasopharynx, external ear canals are unremarkable. Skin: Warm and dry, no rashes noted.  Cardiac: Regular rate and rhythm, no murmurs rubs or gallops.  Respiratory: Clear to auscultation bilaterally. Not using accessory muscles, speaking in full sentences.   Abdominal: Soft, nontender, nondistended, positive bowel sounds, no masses, no organomegaly.  Musculoskeletal: Shoulder, elbow, wrist, hip, knee, ankle stable, and with full range of motion.  Impression and Recommendations:    The patient was counselled, risk factors were discussed, anticipatory guidance given.  Annual physical exam Routine physical as above. Flu shot today. Pap smear was done last month.   Essential hypertension, benign Well-controlled, no changes needed.  Multiple thyroid nodules Repeat thyroid ultrasound is needed now, nodule 2 met criteria for repeat ultrasound, nodule 4 was biopsied and showed benign follicular epithelium.  Previously seen nodules are either stable or have disappeared, no further thyroid imaging indicated at this time.  Adjustment disorder Currently in the divorce process. Adding Lexapro 5, return in 1 month for repeat PHQ and GAD. She can continue to use Valium as needed.  Irritable bowel syndrome with diarrhea IBS with probably improved with Lexapro treatment. Continue fiber supplements.   ___________________________________________ Gwen Her. Dianah Field, M.D., ABFM., CAQSM. Primary Care and Sports Medicine [facility]  Adjunct Professor of [place] [employer] [facility] [employer] [facility] [employer]

## 2019-07-22 NOTE — Assessment & Plan Note (Addendum)
Repeat thyroid ultrasound is needed now, nodule 2 met criteria for repeat ultrasound, nodule 4 was biopsied and showed benign follicular epithelium.  Previously seen nodules are either stable or have disappeared, no further thyroid imaging indicated at this time.

## 2019-07-23 ENCOUNTER — Encounter: Payer: Self-pay | Admitting: Sports Medicine

## 2019-07-23 LAB — CBC
HCT: 41.8 % (ref 35.0–45.0)
Hemoglobin: 13.9 g/dL (ref 11.7–15.5)
MCH: 30 pg (ref 27.0–33.0)
MCHC: 33.3 g/dL (ref 32.0–36.0)
MCV: 90.1 fL (ref 80.0–100.0)
MPV: 10.6 fL (ref 7.5–12.5)
Platelets: 288 10*3/uL (ref 140–400)
RBC: 4.64 10*6/uL (ref 3.80–5.10)
RDW: 12.7 % (ref 11.0–15.0)
WBC: 7.3 10*3/uL (ref 3.8–10.8)

## 2019-07-23 LAB — TSH: TSH: 0.43 mIU/L

## 2019-07-23 LAB — LIPID PANEL W/REFLEX DIRECT LDL
Cholesterol: 182 mg/dL (ref <200)
HDL: 45 mg/dL — ABNORMAL LOW (ref >=50)
LDL Cholesterol (Calc): 110 mg/dL (calc) — ABNORMAL HIGH
Non-HDL Cholesterol (Calc): 137 mg/dL (calc) — ABNORMAL HIGH (ref <130)
Total CHOL/HDL Ratio: 4 (calc) (ref <5.0)
Triglycerides: 159 mg/dL — ABNORMAL HIGH (ref <150)

## 2019-07-23 LAB — COMPLETE METABOLIC PANEL WITH GFR
AG Ratio: 1.6 (calc) (ref 1.0–2.5)
ALT: 33 U/L — ABNORMAL HIGH (ref 6–29)
AST: 24 U/L (ref 10–30)
Albumin: 4.4 g/dL (ref 3.6–5.1)
Alkaline phosphatase (APISO): 57 U/L (ref 31–125)
BUN: 11 mg/dL (ref 7–25)
CO2: 30 mmol/L (ref 20–32)
Calcium: 10 mg/dL (ref 8.6–10.2)
Chloride: 103 mmol/L (ref 98–110)
Creat: 0.71 mg/dL (ref 0.50–1.10)
GFR, Est African American: 120 mL/min/{1.73_m2} (ref >=60)
GFR, Est Non African American: 104 mL/min/{1.73_m2} (ref >=60)
Globulin: 2.7 g/dL (calc) (ref 1.9–3.7)
Glucose, Bld: 112 mg/dL — ABNORMAL HIGH (ref 65–99)
Potassium: 4.2 mmol/L (ref 3.5–5.3)
Sodium: 141 mmol/L (ref 135–146)
Total Bilirubin: 0.7 mg/dL (ref 0.2–1.2)
Total Protein: 7.1 g/dL (ref 6.1–8.1)

## 2019-07-23 LAB — HEMOGLOBIN A1C
Hgb A1c MFr Bld: 5.5 % of total Hgb (ref <5.7)
Mean Plasma Glucose: 111 (calc)
eAG (mmol/L): 6.2 (calc)

## 2019-07-25 ENCOUNTER — Ambulatory Visit (INDEPENDENT_AMBULATORY_CARE_PROVIDER_SITE_OTHER): Payer: BC Managed Care – PPO

## 2019-07-25 ENCOUNTER — Other Ambulatory Visit: Payer: Self-pay

## 2019-07-25 DIAGNOSIS — E042 Nontoxic multinodular goiter: Secondary | ICD-10-CM

## 2019-07-26 ENCOUNTER — Other Ambulatory Visit: Payer: Self-pay | Admitting: Sports Medicine

## 2019-07-26 DIAGNOSIS — I10 Essential (primary) hypertension: Secondary | ICD-10-CM

## 2019-07-31 ENCOUNTER — Encounter: Payer: Self-pay | Admitting: Sports Medicine

## 2019-08-01 MED ORDER — DIAZEPAM 5 MG PO TABS: ORAL_TABLET | ORAL | 2 refills | Status: DC

## 2019-08-18 ENCOUNTER — Other Ambulatory Visit: Payer: Self-pay

## 2019-08-18 ENCOUNTER — Ambulatory Visit (INDEPENDENT_AMBULATORY_CARE_PROVIDER_SITE_OTHER): Payer: BC Managed Care – PPO | Admitting: Sports Medicine

## 2019-08-18 DIAGNOSIS — F4329 Adjustment disorder with other symptoms: Secondary | ICD-10-CM

## 2019-08-18 DIAGNOSIS — Z113 Encounter for screening for infections with a predominantly sexual mode of transmission: Secondary | ICD-10-CM

## 2019-08-18 MED ORDER — ESCITALOPRAM OXALATE 5 MG PO TABS: 5.0000 mg | ORAL_TABLET | Freq: Every day | ORAL | 3 refills | Status: AC

## 2019-08-18 MED ORDER — DIAZEPAM 5 MG PO TABS: ORAL_TABLET | ORAL | 2 refills | Status: AC

## 2019-08-18 NOTE — Assessment & Plan Note (Signed)
Adding STD screening.

## 2019-08-18 NOTE — Progress Notes (Signed)
Subjective:    CC: Follow-up  HPI: Much better with Lexapro.  She does desire STD screening, no symptoms.  I reviewed the past medical history, family history, social history, surgical history, and allergies today and no changes were needed.  Please see the problem list section below in epic for further details.  Past Medical History: Past Medical History:  Diagnosis Date  . Esophagitis   . Hypertension   . Migraines    Past Surgical History: Past Surgical History:  Procedure Laterality Date  . gallbladder removed  2005  . ovarian cyst removed  2007   Social History: Social History   Socioeconomic History  . Marital status: Married    Spouse name: Not on file  . Number of children: 1  . Years of education: Not on file  . Highest education level: Master's degree (e.g., MA, MS, MEng, MEd, MSW, MBA)  Occupational History  . Not on file  Social Needs  . Financial resource strain: Not on file  . Food insecurity    Worry: Not on file    Inability: Not on file  . Transportation needs    Medical: Not on file    Non-medical: Not on file  Tobacco Use  . Smoking status: Never Smoker  . Smokeless tobacco: Never Used  Substance and Sexual Activity  . Alcohol use: No  . Drug use: No  . Sexual activity: Yes    Birth control/protection: None  Lifestyle  . Physical activity    Days per week: Not on file    Minutes per session: Not on file  . Stress: Not on file  Relationships  . Social Herbalist on phone: Not on file    Gets together: Not on file    Attends religious service: Not on file    Active member of club or organization: Not on file    Attends meetings of clubs or organizations: Not on file    Relationship status: Not on file  Other Topics Concern  . Not on file  Social History Narrative   Lives at home with husband and child   Right handed   Caffeine: about 3 cups daily   Family History: Family History  Problem Relation Age of Onset  .  Hypertension Mother   . Migraines Mother        ? when young, not diagnosed   . Hypertension Maternal Grandmother   . Diabetes Maternal Grandmother   . Stroke Maternal Grandmother   . Heart attack Maternal Grandmother   . Other Maternal Grandmother        enlarged heart   . Cancer Paternal Grandmother   . Hypertension Maternal Aunt   . Cancer Paternal Aunt    Allergies: Allergies  Allergen Reactions  . Cephalosporins Shortness Of Breath    Increased HR  . Quinolones Shortness Of Breath    Increased HR   Medications: See med rec.  Review of Systems: No fevers, chills, night sweats, weight loss, chest pain, or shortness of breath.   Objective:    General: Well Developed, well nourished, and in no acute distress.  Neuro: Alert and oriented x3, extra-ocular muscles intact, sensation grossly intact.  HEENT: Normocephalic, atraumatic, pupils equal round reactive to light, neck supple, no masses, no lymphadenopathy, thyroid nonpalpable.  Skin: Warm and dry, no rashes. Cardiac: Regular rate and rhythm, no murmurs rubs or gallops, no lower extremity edema.  Respiratory: Clear to auscultation bilaterally. Not using accessory muscles, speaking in full  sentences.  Impression and Recommendations:    Adjustment disorder Overall doing much better, Lexapro 5 has worked well, refilling Valium, it sounds as though the pharmacy would not let her get an early refill. At this point she can return to see me in 4 to 6 months.  Screening for STD (sexually transmitted disease) Adding STD screening.  I spent 25 minutes with this patient, greater than 50% was face-to-face time counseling regarding the above diagnoses.  ___________________________________________ Ihor Austin. Benjamin Stain, M.D., ABFM., CAQSM. Primary Care and Sports Medicine [facility]  Adjunct Professor of Family Medicine  [employer]

## 2019-08-18 NOTE — Assessment & Plan Note (Signed)
Overall doing much better, Lexapro 5 has worked well, refilling Valium, it sounds as though the pharmacy would not let her get an early refill. At this point she can return to see me in 4 to 6 months.

## 2019-08-19 ENCOUNTER — Ambulatory Visit: Payer: BC Managed Care – PPO | Admitting: Sports Medicine

## 2019-08-19 LAB — HIV ANTIBODY (ROUTINE TESTING W REFLEX): HIV 1&2 Ab, 4th Generation: NONREACTIVE

## 2019-08-19 LAB — HEPATITIS PANEL, ACUTE
Hep A IgM: NONREACTIVE
Hep B C IgM: NONREACTIVE
Hepatitis B Surface Ag: NONREACTIVE
Hepatitis C Ab: NONREACTIVE
SIGNAL TO CUT-OFF: 0.01 (ref <1.00)

## 2019-08-19 LAB — C. TRACHOMATIS/N. GONORRHOEAE RNA
C. trachomatis RNA, TMA: NOT DETECTED
N. gonorrhoeae RNA, TMA: NOT DETECTED

## 2019-08-19 LAB — HSV 2 ANTIBODY, IGG: HSV 2 Glycoprotein G Ab, IgG: 16.4 index — ABNORMAL HIGH

## 2019-08-19 LAB — RPR: RPR Ser Ql: NONREACTIVE

## 2019-09-23 ENCOUNTER — Encounter: Payer: Self-pay | Admitting: Sports Medicine

## 2020-03-10 DIAGNOSIS — I10 Essential (primary) hypertension: Secondary | ICD-10-CM

## 2020-03-10 MED ORDER — IRBESARTAN-HYDROCHLOROTHIAZIDE 150-12.5 MG PO TABS: 1.0000 | ORAL_TABLET | Freq: Every day | ORAL | 0 refills | Status: DC

## 2020-03-31 DIAGNOSIS — I10 Essential (primary) hypertension: Secondary | ICD-10-CM

## 2020-04-01 MED ORDER — IRBESARTAN-HYDROCHLOROTHIAZIDE 150-12.5 MG PO TABS: 1.0000 | ORAL_TABLET | Freq: Every day | ORAL | 0 refills | Status: DC

## 2020-08-30 ENCOUNTER — Other Ambulatory Visit: Payer: Self-pay | Admitting: Sports Medicine

## 2020-08-30 DIAGNOSIS — I10 Essential (primary) hypertension: Secondary | ICD-10-CM

## 2020-12-01 ENCOUNTER — Encounter: Payer: BC Managed Care – PPO | Admitting: Sports Medicine

## 2021-05-30 IMAGING — US US THYROID
1 series · 13 of 25 positions shown · non-contrast
Comparison: Prior thyroid biopsy 08/20/2018; prior thyroid
ultrasound 08/01/2018

CLINICAL DATA: Goiter. 42-year-old female with a history of
multiple thyroid nodules. She previously underwent biopsy of the
left inferior thyroid nodule on 08/20/2018.

EXAM:
THYROID ULTRASOUND
TECHNIQUE: Ultrasound examination of the thyroid gland and adjacent soft
tissues was performed.

[Series 1: us thyroid · 0.09mm/px · 13 of 141 slices shown]
[im 1/141]
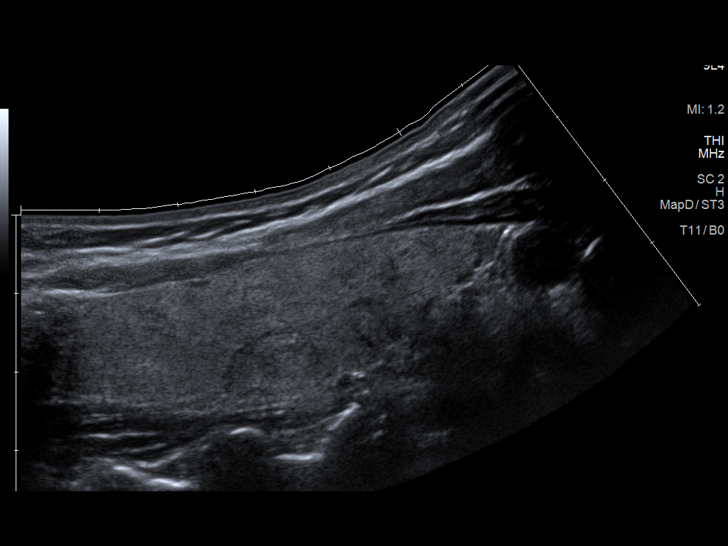
[im 12/141]
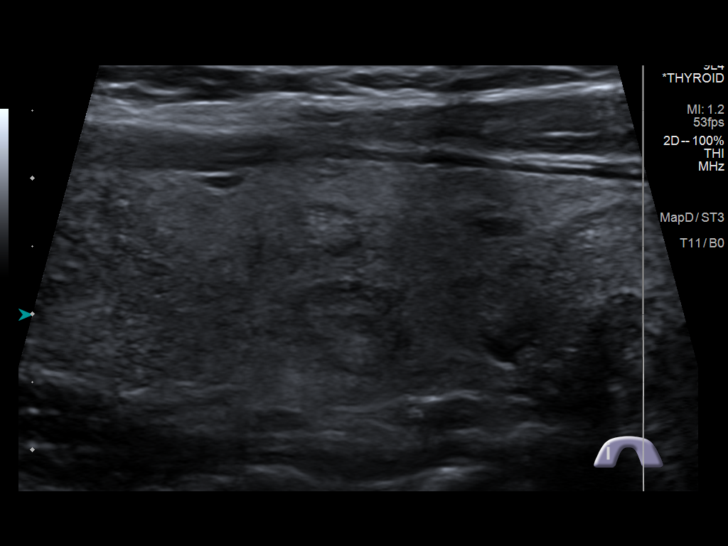
[im 24/141]
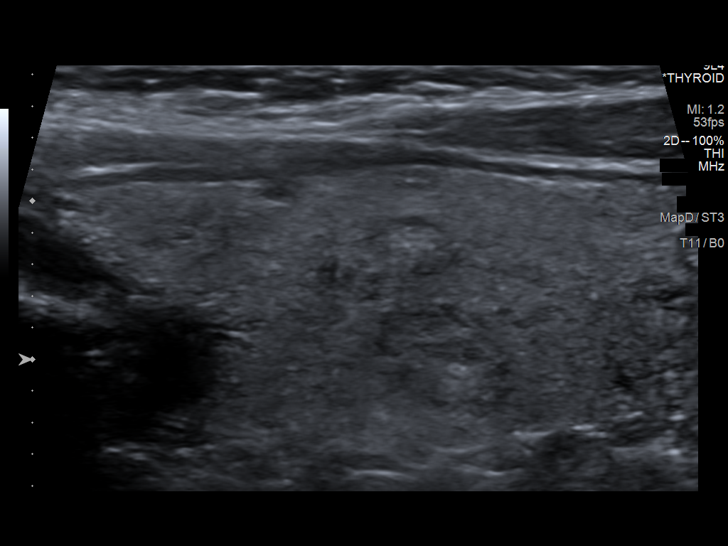
[im 36/141]
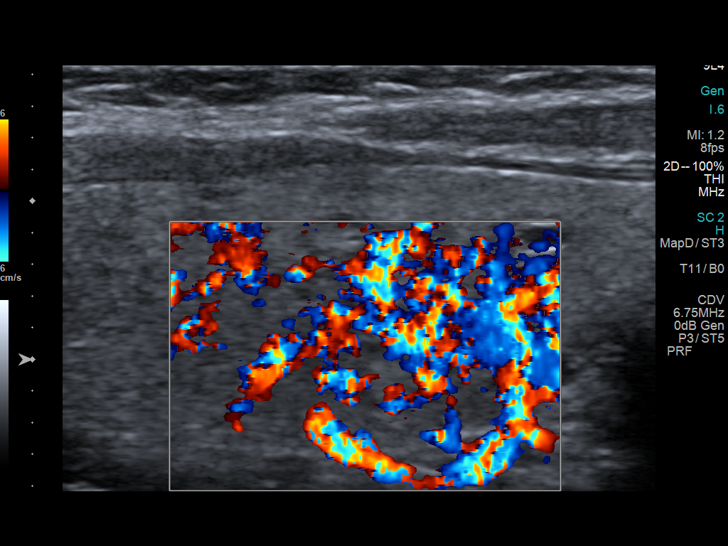
[im 47/141]
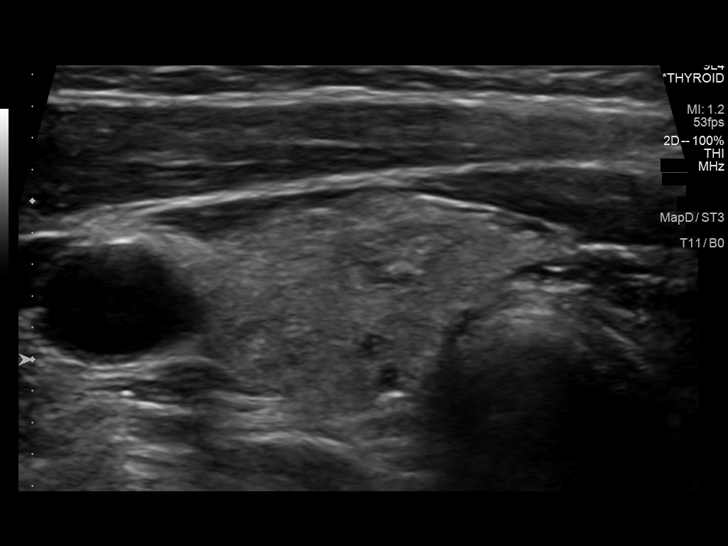
[im 59/141]
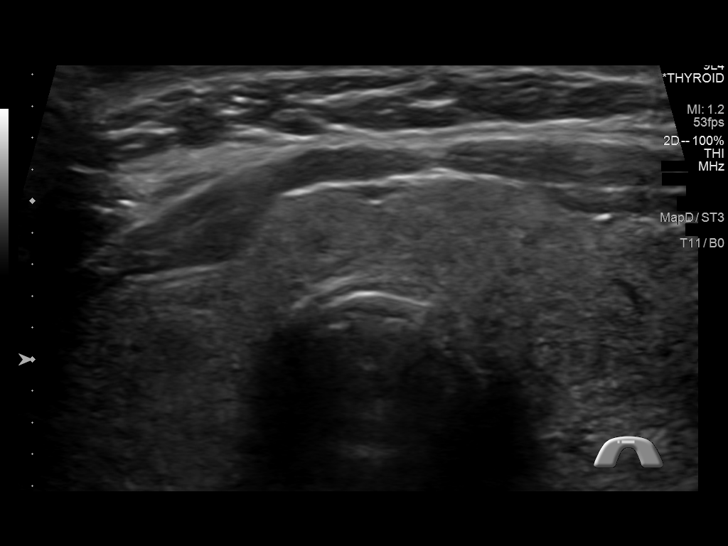
[im 71/141]
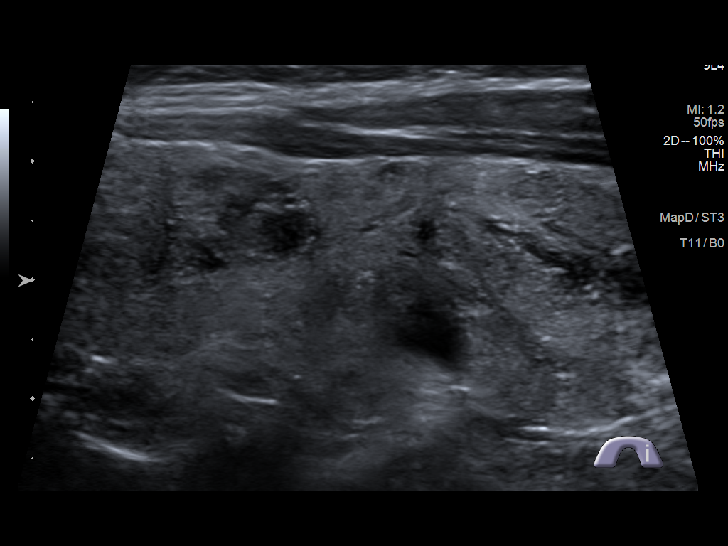
[im 82/141]
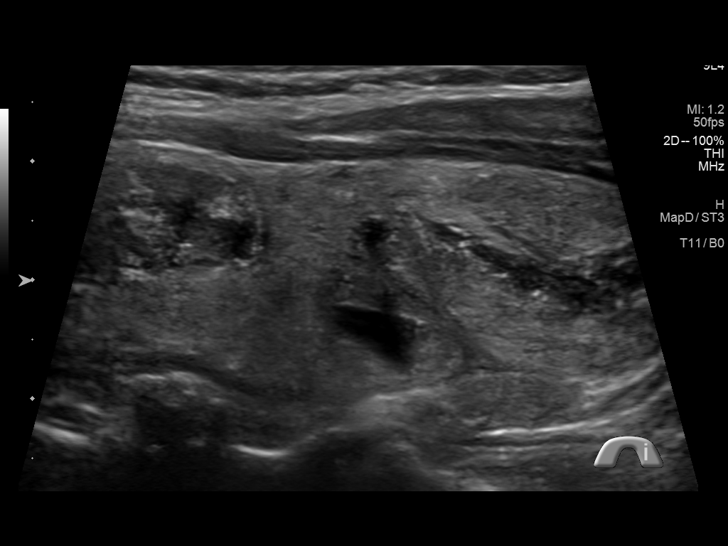
[im 94/141]
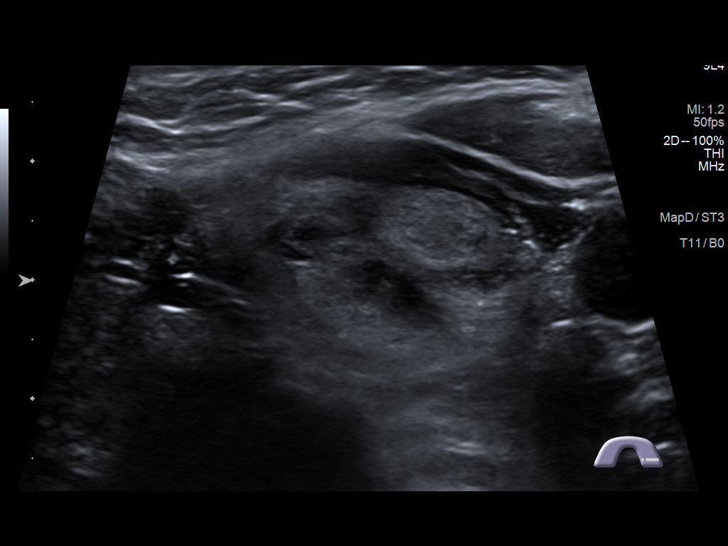
[im 106/141]
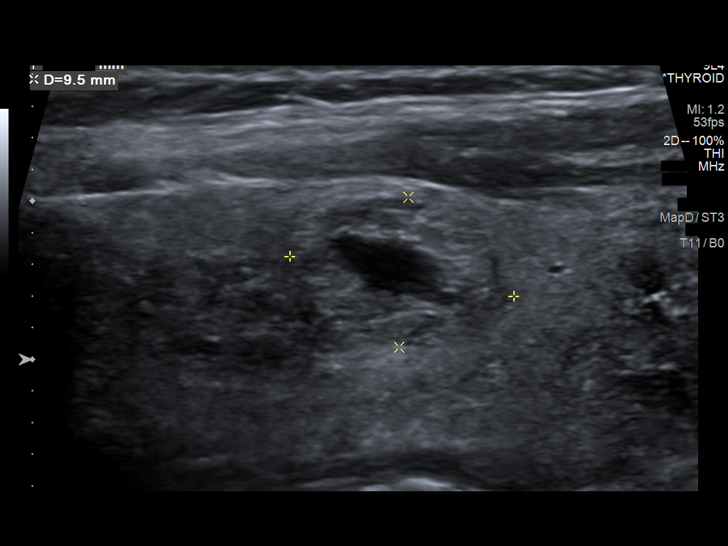
[im 117/141]
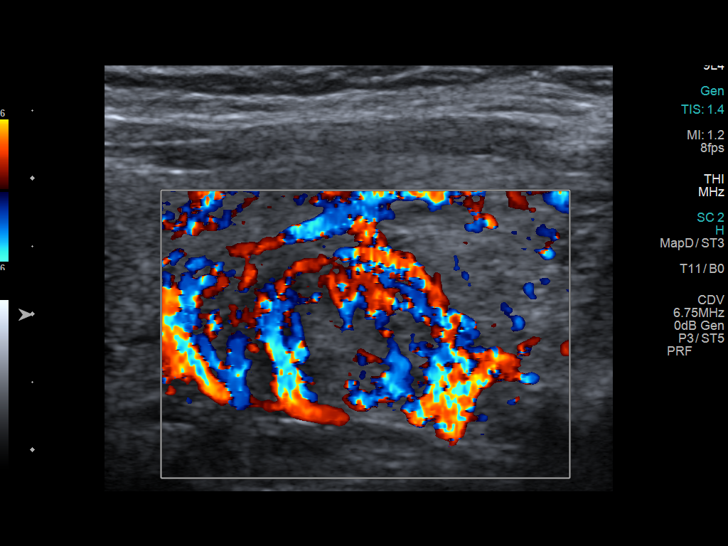
[im 129/141]
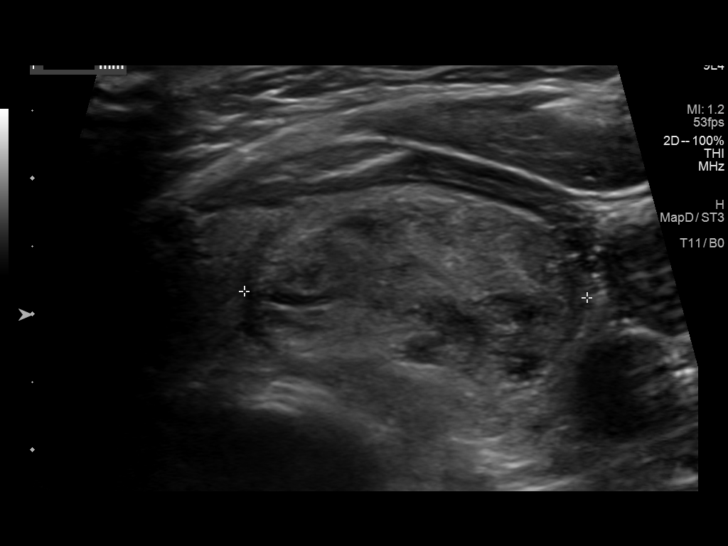
[im 141/141]
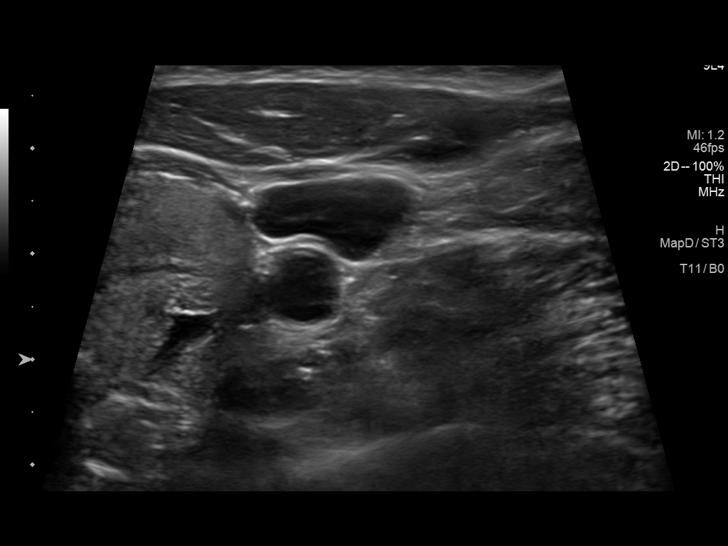

[13 of 25 positions shown; findings below may reference images not displayed]

FINDINGS: Parenchymal Echotexture: Mildly heterogenous

Isthmus: 0.6 cm

Right lobe: 5.8 x 1.8 x 2.0 cm

Left lobe: 6.1 x 1.9 x 2.5 cm

_________________________________________________________

Estimated total number of nodules >/= 1 cm: 6-10

Number of spongiform nodules >/=  2 cm not described below (TR1): 0

Number of mixed cystic and solid nodules >/= 1.5 cm not described
below (TR2): 0

_________________________________________________________

Nodule # 2: Unable to identify separate from the background thyroid
parenchyma on the present study.

_________________________________________________________

Nodule pound 8: The previously biopsied nodule in the left inferior
gland is essentially unchanged at 2.9 x 1.7 x 2.5 cm compared to
x 1.9 x 2.2 cm previously.

_________________________________________________________

Numerous additional thyroid nodules and pseudo nodules are present
bilaterally and in the thyroid isthmus. All nodules are isoechoic
and ill-defined. Many are favored to represent pseudo nodules. None
of the lesions meets criteria for biopsy or further imaging
follow-up.
IMPRESSION: 1. No significant interval change in the size or appearance of
previously biopsied nodule in the left inferior gland.
2. The previously noted TI-RADS category 3 nodule in the right mid
gland has involuted over the interim and is now barely detectable
separate from the background parenchyma. Involution over time is
consistent with benignity further follow-up is required.
3. Stable appearance of additional bilateral nodules and pseudo
nodules which do not meet criteria for biopsy or dedicated
follow-up.

The above is in keeping with the ACR TI-RADS recommendations - [HOSPITAL] 2329;[DATE].

## 2021-08-25 ENCOUNTER — Other Ambulatory Visit: Payer: Self-pay | Admitting: Sports Medicine

## 2021-08-25 DIAGNOSIS — I10 Essential (primary) hypertension: Secondary | ICD-10-CM

## 2021-08-25 NOTE — Telephone Encounter (Signed)
Patient stated that she moved to [place] about 2 years ago and she stated that she thinks the pharmacy is the one that sent this request in, not her. AM
